# Patient Record
Sex: Female | Born: 1995 | Race: Black or African American | Hispanic: No | Marital: Single | Smoking: Never smoker
Health system: Southern US, Community
[De-identification: ages and names within clinical notes are randomized; demographics above are authoritative.]

## PROBLEM LIST (undated history)

## (undated) DIAGNOSIS — G43909 Migraine, unspecified, not intractable, without status migrainosus: Secondary | ICD-10-CM

## (undated) DIAGNOSIS — N879 Dysplasia of cervix uteri, unspecified: Secondary | ICD-10-CM

## (undated) DIAGNOSIS — B9689 Other specified bacterial agents as the cause of diseases classified elsewhere: Secondary | ICD-10-CM

## (undated) HISTORY — DX: Migraine, unspecified, not intractable, without status migrainosus: G43.909

## (undated) HISTORY — PX: OTHER SURGICAL HISTORY: SHX169

## (undated) HISTORY — DX: Dysplasia of cervix uteri, unspecified: N87.9

## (undated) HISTORY — DX: Other specified bacterial agents as the cause of diseases classified elsewhere: B96.89

---
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
## 2018-08-26 ENCOUNTER — Other Ambulatory Visit: Payer: Self-pay | Admitting: *Deleted

## 2018-08-26 DIAGNOSIS — Z20822 Contact with and (suspected) exposure to covid-19: Secondary | ICD-10-CM

## 2018-09-02 LAB — NOVEL CORONAVIRUS, NAA: SARS-CoV-2, NAA: NOT DETECTED

## 2020-01-09 ENCOUNTER — Ambulatory Visit (HOSPITAL_COMMUNITY)
Admission: EM | Admit: 2020-01-09 | Discharge: 2020-01-09 | Disposition: A | Discharge status: Home / Self Care | Payer: Managed Care, Other (non HMO) | Attending: Family Medicine | Admitting: Family Medicine

## 2020-01-09 ENCOUNTER — Other Ambulatory Visit: Payer: Self-pay

## 2020-01-09 ENCOUNTER — Encounter (HOSPITAL_COMMUNITY): Payer: Self-pay

## 2020-01-09 DIAGNOSIS — M25511 Pain in right shoulder: Secondary | ICD-10-CM

## 2020-01-09 MED ORDER — CYCLOBENZAPRINE HCL 10 MG PO TABS: 10.0000 mg | ORAL_TABLET | Freq: Three times a day (TID) | ORAL | 0 refills | Status: DC | PRN
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
## 2020-03-10 ENCOUNTER — Encounter: Payer: Self-pay | Admitting: Nurse Practitioner

## 2020-03-10 ENCOUNTER — Other Ambulatory Visit: Payer: Self-pay

## 2020-03-10 ENCOUNTER — Ambulatory Visit: Payer: Managed Care, Other (non HMO) | Admitting: Nurse Practitioner

## 2020-03-10 VITALS — BP 116/78 | HR 87 | Temp 98.0°F | Ht 64.0 in | Wt 113.6 lb

## 2020-03-10 DIAGNOSIS — Z7689 Persons encountering health services in other specified circumstances: Secondary | ICD-10-CM | POA: Diagnosis not present

## 2020-03-10 DIAGNOSIS — Z1159 Encounter for screening for other viral diseases: Secondary | ICD-10-CM | POA: Diagnosis not present

## 2020-03-10 DIAGNOSIS — Z0000 Encounter for general adult medical examination without abnormal findings: Secondary | ICD-10-CM

## 2020-03-10 DIAGNOSIS — Z114 Encounter for screening for human immunodeficiency virus [HIV]: Secondary | ICD-10-CM

## 2020-03-10 NOTE — Progress Notes (Signed)
I,Tianna Badgett,acting as a Education administrator for [employer], NP.,have documented all relevant documentation on the behalf of [employer], NP,as directed by  Bary Castilla, NP while in the presence of Bary Castilla, NP.  This visit occurred during the SARS-CoV-2 public health emergency.  Safety protocols were in place, including screening questions prior to the visit, additional usage of staff PPE, and extensive cleaning of exam room while observing appropriate contact time as indicated for disinfecting solutions.  Subjective:     Patient ID: Alexis Conway , female    DOB: 01/09/1996 , 25 y.o.   MRN: [id number]   Chief Complaint  Patient presents with  . Establish Care    HPI  Patient is here to establish care. She has no concerns at this time and would like to get a full physical exam.  She works Surveyor, [employer]. She is currently not in school . Both her Covid shot were done. However, she still has not gotten her booster shot yet. She is still thinking about it. No medical history. She sees a Materials engineer. We will get her medical records from her pediatric office and her OBGYN. She does have a naxplanoon in her left arm. She is sexaully active She does not smoke but she does drink occasionally.  She does not take any medication except OTC pain reliever as needed.     No past medical history on file.   Family History  Problem Relation Age of Onset  . Movement disorder Mother        Tremors  . Hypertension Father   . Diabetes Paternal Grandmother   . Glaucoma Paternal Grandfather      Current Outpatient Medications:  .  ibuprofen (ADVIL) 200 MG tablet, Take 400 mg by mouth every 6 (six) hours as needed., Disp: , Rfl:    Allergies  Allergen Reactions  . No Known Allergies       The patient states she uses naxplanon for birth control. Last LMP was No LMP recorded. Patient has had an implant.. Negative for: breast discharge, breast lump(s), breast pain and breast self exam.  Associated symptoms include abnormal vaginal bleeding. Pertinent negatives include abnormal bleeding (hematology), anxiety, decreased libido, depression, difficulty falling sleep, dyspareunia, history of infertility, nocturia, sexual dysfunction, sleep disturbances, urinary incontinence, urinary urgency, vaginal discharge and vaginal itching. Diet regular.The patient states her exercise level is    . The patient's tobacco use is:  Social History   Tobacco Use  Smoking Status Never Smoker  Smokeless Tobacco Never Used  . She has been exposed to passive smoke. The patient's alcohol use is: occasionally  Social History   Substance and Sexual Activity  Alcohol Use Yes  . Alcohol/week: 1.0 standard drink  . Types: 1 Glasses of wine per week  . Additional information: Last pap will get records from OBGYN   Review of Systems  Constitutional: Negative.  Negative for chills, fatigue and fever.  HENT: Negative.  Negative for congestion.   Eyes: Negative.   Respiratory: Negative.  Negative for cough, chest tightness, shortness of breath and wheezing.   Cardiovascular: Negative.  Negative for chest pain and palpitations.  Gastrointestinal: Negative for abdominal pain, constipation and diarrhea.  Endocrine: Negative.   Genitourinary: Negative.  Negative for dysuria, flank pain and menstrual problem.  Musculoskeletal: Negative.  Negative for back pain and gait problem.  Skin: Negative.   Allergic/Immunologic: Negative.   Neurological: Negative.  Negative for headaches.  Hematological: Negative.   Psychiatric/Behavioral: Negative.  Today's Vitals   03/10/20 0924  BP: 116/78  Pulse: 87  Temp: 98 F (36.7 C)  TempSrc: Oral  Weight: 113 lb 9.6 oz (51.5 kg)  Height: 5' 4" (1.626 m)   Body mass index is 19.5 kg/m.  Wt Readings from Last 3 Encounters:  03/10/20 113 lb 9.6 oz (51.5 kg)  10/04/14 109 lb 3.2 oz (49.5 kg) (16 %, Z= -1.00)*   * Growth percentiles are based on CDC (Girls,  2-20 Years) data.    Objective:  Physical Exam Constitutional:      Appearance: Normal appearance. She is normal weight.  HENT:     Head: Normocephalic.     Right Ear: Tympanic membrane, ear canal and external ear normal.     Left Ear: Tympanic membrane, ear canal and external ear normal.     Nose:     Comments: Deferred. Masked     Mouth/Throat:     Comments: Deferred. Masked  Eyes:     Extraocular Movements: Extraocular movements intact.     Conjunctiva/sclera: Conjunctivae normal.     Pupils: Pupils are equal, round, and reactive to light.  Cardiovascular:     Rate and Rhythm: Normal rate and regular rhythm.     Pulses: Normal pulses.     Heart sounds: Normal heart sounds.  Pulmonary:     Effort: Pulmonary effort is normal. No respiratory distress.     Breath sounds: Normal breath sounds. No wheezing.  Chest:  Breasts:     Tanner Score is 4.     Right: Normal.     Left: Normal.    Abdominal:     General: Bowel sounds are normal.     Tenderness: There is no abdominal tenderness. There is no guarding.  Genitourinary:    Comments: Deferred. Patient see OBGYN. Has Naxplanon in left arm.  Musculoskeletal:        General: Normal range of motion.     Cervical back: Normal range of motion and neck supple.  Skin:    General: Skin is dry.     Capillary Refill: Capillary refill takes less than 2 seconds.  Neurological:     General: No focal deficit present.     Mental Status: She is alert and oriented to person, place, and time. Mental status is at baseline.     Motor: No weakness.     Gait: Gait normal.  Psychiatric:        Mood and Affect: Mood normal.        Behavior: Behavior normal.        Thought Content: Thought content normal.        Judgment: Judgment normal.      Assessment And Plan:     1. Encounter to establish care -Establish care today. Went over patient's medical history, family history, social history.  - CBC - Hemoglobin A1c - CMP14+EGFR -  Lipid panel  2. Encounter for annual physical exam -Discussed the importance of immunization, screening, and taking multivitamins. Discussed the importance of eating healthy and exercising for atleast 30-45 min. Daily. Will check labs today  - CBC - Hemoglobin A1c - CMP14+EGFR - Lipid panel  3. Encounter for screening for HIV - HIV Antibody (routine testing w rflx)  4. Encounter for hepatitis C screening test for low risk patient - Hepatitis C antibody   Follow up: One year for physical or sooner if needed    Patient was given opportunity to ask questions. Patient verbalized understanding of the plan  and was able to repeat key elements of the plan. All questions were answered to their satisfaction.   Bary Castilla, NP   I, Bary Castilla, NP, have reviewed all documentation for this visit. The documentation on 03/10/20 for the exam, diagnosis, procedures, and orders are all accurate and complete.  THE PATIENT IS ENCOURAGED TO PRACTICE SOCIAL DISTANCING DUE TO THE COVID-19 PANDEMIC.

## 2020-03-11 LAB — CBC
Hematocrit: 38.7 % (ref 34.0–46.6)
Hemoglobin: 12.5 g/dL (ref 11.1–15.9)
MCH: 27.5 pg (ref 26.6–33.0)
MCHC: 32.3 g/dL (ref 31.5–35.7)
MCV: 85 fL (ref 79–97)
Platelets: 283 10*3/uL (ref 150–450)
RBC: 4.55 x10E6/uL (ref 3.77–5.28)
RDW: 12.2 % (ref 11.7–15.4)
WBC: 6.7 10*3/uL (ref 3.4–10.8)

## 2020-03-11 LAB — HEMOGLOBIN A1C
Est. average glucose Bld gHb Est-mCnc: 105 mg/dL
Hgb A1c MFr Bld: 5.3 % (ref 4.8–5.6)

## 2020-03-11 LAB — CMP14+EGFR
ALT: 10 IU/L (ref 0–32)
AST: 21 IU/L (ref 0–40)
Albumin/Globulin Ratio: 2 (ref 1.2–2.2)
Albumin: 4.8 g/dL (ref 3.9–5.0)
Alkaline Phosphatase: 62 IU/L (ref 44–121)
BUN/Creatinine Ratio: 10 (ref 9–23)
BUN: 11 mg/dL (ref 6–20)
Bilirubin Total: 0.3 mg/dL (ref 0.0–1.2)
CO2: 23 mmol/L (ref 20–29)
Calcium: 9.6 mg/dL (ref 8.7–10.2)
Chloride: 103 mmol/L (ref 96–106)
Creatinine, Ser: 1.07 mg/dL — ABNORMAL HIGH (ref 0.57–1.00)
GFR calc Af Amer: 84 mL/min/{1.73_m2} (ref >59)
GFR calc non Af Amer: 73 mL/min/{1.73_m2} (ref >59)
Globulin, Total: 2.4 g/dL (ref 1.5–4.5)
Glucose: 68 mg/dL (ref 65–99)
Potassium: 4.2 mmol/L (ref 3.5–5.2)
Sodium: 141 mmol/L (ref 134–144)
Total Protein: 7.2 g/dL (ref 6.0–8.5)

## 2020-03-11 LAB — LIPID PANEL
Chol/HDL Ratio: 2.6 ratio (ref 0.0–4.4)
Cholesterol, Total: 118 mg/dL (ref 100–199)
HDL: 45 mg/dL (ref >39)
LDL Chol Calc (NIH): 60 mg/dL (ref 0–99)
Triglycerides: 60 mg/dL (ref 0–149)
VLDL Cholesterol Cal: 13 mg/dL (ref 5–40)

## 2020-03-11 LAB — HIV ANTIBODY (ROUTINE TESTING W REFLEX): HIV Screen 4th Generation wRfx: NONREACTIVE

## 2020-03-11 LAB — HEPATITIS C ANTIBODY: Hep C Virus Ab: <0.1 s/co ratio (ref 0.0–0.9)

## 2020-03-26 ENCOUNTER — Ambulatory Visit (HOSPITAL_COMMUNITY)
Admission: EM | Admit: 2020-03-26 | Discharge: 2020-03-26 | Disposition: A | Discharge status: Home / Self Care | Payer: Managed Care, Other (non HMO) | Attending: Urgent Care | Admitting: Urgent Care

## 2020-03-26 ENCOUNTER — Other Ambulatory Visit: Payer: Self-pay

## 2020-03-26 ENCOUNTER — Encounter (HOSPITAL_COMMUNITY): Payer: Self-pay

## 2020-03-26 DIAGNOSIS — J029 Acute pharyngitis, unspecified: Secondary | ICD-10-CM | POA: Insufficient documentation

## 2020-03-26 DIAGNOSIS — Z20822 Contact with and (suspected) exposure to covid-19: Secondary | ICD-10-CM | POA: Diagnosis not present

## 2020-03-26 LAB — POCT RAPID STREP A, ED / UC: Streptococcus, Group A Screen (Direct): NEGATIVE

## 2020-03-26 MED ORDER — NAPROXEN 500 MG PO TABS: 500.0000 mg | ORAL_TABLET | Freq: Two times a day (BID) | ORAL | 0 refills | Status: DC

## 2020-03-26 MED ORDER — AMOXICILLIN 500 MG PO CAPS: 500.0000 mg | ORAL_CAPSULE | Freq: Two times a day (BID) | ORAL | 0 refills | Status: DC

## 2020-03-26 NOTE — ED Provider Notes (Signed)
[facility]   MRN: [id number] DOB: [birth year]  Subjective:   Alexis Conway is a 25 y.o. female presenting for 2-day history of acute onset moderate to severe worsening right-sided throat pain neck pain.  Patient states that the throat pain radiates into her right ear.  Has also felt feverish.  She is COVID vaccinated.  Denies cough, chest pain, shortness of breath.  She is not a smoker.  No history of lung disorders.  No sick contacts to her knowledge.  No current facility-administered medications for this encounter.  Current Outpatient Medications:  .  ibuprofen (ADVIL) 200 MG tablet, Take 400 mg by mouth every 6 (six) hours as needed., Disp: , Rfl:    Allergies  Allergen Reactions  . No Known Allergies     History reviewed. No pertinent past medical history.   History reviewed. No pertinent surgical history.  Family History  Problem Relation Age of Onset  . Movement disorder Mother        Tremors  . Hypertension Father   . Diabetes Paternal Grandmother   . Glaucoma Paternal Grandfather     Social History   Tobacco Use  . Smoking status: Never Smoker  . Smokeless tobacco: Never Used  Substance Use Topics  . Alcohol use: Yes    Alcohol/week: 1.0 standard drink    Types: 1 Glasses of wine per week  . Drug use: No    ROS   Objective:   Vitals: BP (!) 133/103   Pulse (!) 112   Temp 100 F (37.8 C)   Resp 17   SpO2 100%   Physical Exam Constitutional:      General: She is not in acute distress.    Appearance: She is well-developed. She is not ill-appearing.  HENT:     Head: Normocephalic and atraumatic.     Right Ear: Tympanic membrane and ear canal normal. No drainage or tenderness. No middle ear effusion. Tympanic membrane is not erythematous.     Left Ear: Tympanic membrane and ear canal normal. No drainage or tenderness.  No middle ear effusion. Tympanic membrane is not erythematous.     Nose: No congestion or rhinorrhea.      Mouth/Throat:     Mouth: Mucous membranes are moist. No oral lesions.     Pharynx: Pharyngeal swelling, oropharyngeal exudate (right sided only) and posterior oropharyngeal erythema (right sided only) present. No uvula swelling.     Tonsils: Tonsillar exudate present. No tonsillar abscesses. 2+ on the right.  Eyes:     Extraocular Movements:     Right eye: Normal extraocular motion.     Left eye: Normal extraocular motion.     Conjunctiva/sclera: Conjunctivae normal.     Pupils: Pupils are equal, round, and reactive to light.  Cardiovascular:     Rate and Rhythm: Normal rate.  Pulmonary:     Effort: Pulmonary effort is normal.  Musculoskeletal:     Cervical back: Normal range of motion and neck supple.  Lymphadenopathy:     Cervical: No cervical adenopathy.  Skin:    General: Skin is warm and dry.  Neurological:     General: No focal deficit present.     Mental Status: She is alert and oriented to person, place, and time.  Psychiatric:        Mood and Affect: Mood normal.        Behavior: Behavior normal.     Results for orders placed or performed during the hospital  encounter of 03/26/20 (from the past 24 hour(s))  POCT Rapid Strep A     Status: None   Collection Time: 03/26/20 11:45 AM  Result Value Ref Range   Streptococcus, Group A Screen (Direct) NEGATIVE NEGATIVE    Assessment and Plan :   PDMP not reviewed this encounter.  1. Pharyngitis, unspecified etiology   2. Sore throat     Will treat empirically for pharyngitis given physical exam findings.  Patient is to start amoxicillin, use supportive care otherwise.  Strep culture and COVID-19 testing pending.  Counseled patient on potential for adverse effects with medications prescribed/recommended today, ER and return-to-clinic precautions discussed, patient verbalized understanding.    Wallis Bamberg, PA-C 03/26/20 1154

## 2020-03-26 NOTE — ED Triage Notes (Signed)
Pt in with c/o tonsil inflammation on the right side that has been going on for 3 days now  Pt has tried ibuprofen and warm water gargles with no relief

## 2020-03-27 LAB — SARS CORONAVIRUS 2 (TAT 6-24 HRS): SARS Coronavirus 2: NEGATIVE

## 2020-03-28 LAB — CULTURE, GROUP A STREP (THRC)

## 2020-12-02 ENCOUNTER — Encounter: Payer: Self-pay | Admitting: Internal Medicine

## 2020-12-13 ENCOUNTER — Encounter: Payer: Self-pay | Admitting: Nurse Practitioner

## 2020-12-13 DIAGNOSIS — N879 Dysplasia of cervix uteri, unspecified: Secondary | ICD-10-CM | POA: Insufficient documentation

## 2020-12-22 ENCOUNTER — Encounter: Payer: Self-pay | Admitting: Nurse Practitioner

## 2021-01-06 ENCOUNTER — Other Ambulatory Visit: Payer: Self-pay

## 2021-01-06 DIAGNOSIS — N926 Irregular menstruation, unspecified: Secondary | ICD-10-CM | POA: Insufficient documentation

## 2021-01-06 DIAGNOSIS — N946 Dysmenorrhea, unspecified: Secondary | ICD-10-CM | POA: Insufficient documentation

## 2021-01-09 ENCOUNTER — Other Ambulatory Visit (INDEPENDENT_AMBULATORY_CARE_PROVIDER_SITE_OTHER): Payer: Managed Care, Other (non HMO)

## 2021-01-09 ENCOUNTER — Encounter: Payer: Self-pay | Admitting: Nurse Practitioner

## 2021-01-09 ENCOUNTER — Ambulatory Visit: Payer: Managed Care, Other (non HMO) | Admitting: Nurse Practitioner

## 2021-01-09 VITALS — BP 100/60 | HR 78 | Ht 65.0 in | Wt 118.0 lb

## 2021-01-09 DIAGNOSIS — R110 Nausea: Secondary | ICD-10-CM | POA: Insufficient documentation

## 2021-01-09 DIAGNOSIS — R197 Diarrhea, unspecified: Secondary | ICD-10-CM | POA: Diagnosis not present

## 2021-01-09 DIAGNOSIS — R1030 Lower abdominal pain, unspecified: Secondary | ICD-10-CM

## 2021-01-09 DIAGNOSIS — R1012 Left upper quadrant pain: Secondary | ICD-10-CM

## 2021-01-09 DIAGNOSIS — A048 Other specified bacterial intestinal infections: Secondary | ICD-10-CM | POA: Diagnosis not present

## 2021-01-09 LAB — CBC WITH DIFFERENTIAL/PLATELET
Basophils Absolute: 0.1 10*3/uL (ref 0.0–0.1)
Basophils Relative: 0.8 % (ref 0.0–3.0)
Eosinophils Absolute: 0.1 10*3/uL (ref 0.0–0.7)
Eosinophils Relative: 2 % (ref 0.0–5.0)
HCT: 38.8 % (ref 36.0–46.0)
Hemoglobin: 12.7 g/dL (ref 12.0–15.0)
Lymphocytes Relative: 29.1 % (ref 12.0–46.0)
Lymphs Abs: 2.1 10*3/uL (ref 0.7–4.0)
MCHC: 32.7 g/dL (ref 30.0–36.0)
MCV: 86.4 fl (ref 78.0–100.0)
Monocytes Absolute: 0.5 10*3/uL (ref 0.1–1.0)
Monocytes Relative: 6.3 % (ref 3.0–12.0)
Neutro Abs: 4.4 10*3/uL (ref 1.4–7.7)
Neutrophils Relative %: 61.8 % (ref 43.0–77.0)
Platelets: 269 10*3/uL (ref 150.0–400.0)
RBC: 4.49 Mil/uL (ref 3.87–5.11)
RDW: 12.8 % (ref 11.5–15.5)
WBC: 7.2 10*3/uL (ref 4.0–10.5)

## 2021-01-09 MED ORDER — HYOSCYAMINE SULFATE 0.125 MG SL SUBL: 0.1250 mg | SUBLINGUAL_TABLET | Freq: Four times a day (QID) | SUBLINGUAL | 1 refills | Status: DC | PRN

## 2021-01-09 NOTE — Patient Instructions (Addendum)
The [facility] providers would like to encourage you to use [facility] to communicate with providers for non-urgent requests or questions.  Due to long hold times on the telephone, sending your provider a message by [facility] may be faster and more efficient way to get a response. Please allow 48 business hours for a response.  Please remember that this is for non-urgent requests/questions.  LABS:  Lab work has been ordered for you today. Our lab is located in the basement. Press "B" on the elevator. The lab is located at the first door on the left as you exit the elevator.  HEALTHCARE LAWS AND MY CHART RESULTS: Due to recent changes in healthcare laws, you may see the results of your imaging and laboratory studies on MyChart before your provider has had a chance to review them.   We understand that in some cases there may be results that are confusing or concerning to you. Not all laboratory results come back in the same time frame and the provider may be waiting for multiple results in order to interpret others.  Please give [place] 48 hours in order for your provider to thoroughly review all the results before contacting the office for clarification of your results.   RECOMMENDATIONS: Bland diet No dairy products other than yogurt. Take a probiotic of your choice once a day.  MEDICATION: We have sent the following medication to your pharmacy for you to pick up at your convenience: Hyoscyamine (LEVSIN SL) 0.125 MG SL tablet: Place 1 tablet (0.125 mg total) under the tongue every 6-8 hours as needed.  We have scheduled you a follow up with Dr. Rhea Belton on 03/22/21 at 8:30 am. Please call our office if your symptoms worsen.  It was great seeing you today! Thank you for entrusting me with your care and choosing [facility].  Arnaldo Natal, CRNP  [facility] Diet A bland diet consists of foods that are often soft and do not have a lot of fat, fiber, or extra seasonings. Foods without fat,  fiber, or seasoning are easier for the body to digest. They are also less likely to irritate your mouth, throat, stomach, and other parts of your digestive system. A bland diet is sometimes called a BRAT diet. What is my plan? Your health care provider or food and nutrition specialist (dietitian) may recommend specific changes to your diet to prevent symptoms or to treat your symptoms. These changes may include: Eating small meals often. Cooking food until it is soft enough to chew easily. Chewing your food well. Drinking fluids slowly. Not eating foods that are very spicy, sour, or fatty. Not eating citrus fruits, such as oranges and grapefruit. What do I need to know about this diet? Eat a variety of foods from the bland diet food list. Do not follow a bland diet longer than needed. Ask your health care provider whether you should take vitamins or supplements. What foods can I eat? Grains Hot cereals, such as cream of wheat. Rice. Bread, crackers, or tortillas made from refined white flour. Vegetables Canned or cooked vegetables. Mashed or boiled potatoes. Fruits Bananas. Applesauce. Other types of cooked or canned fruit with the skin and seeds removed, such as canned peaches or pears. Meats and other proteins Scrambled eggs. Creamy peanut butter or other nut butters. Lean, well-cooked meats, such as chicken or fish. Tofu. Soups or broths. Dairy Low-fat dairy products, such as milk, cottage cheese, or yogurt. Beverages Water. Herbal tea. Apple juice. Fats and oils Mild  salad dressings. Canola or olive oil. Sweets and desserts Pudding. Custard. Fruit gelatin. Ice cream. The items listed above may not be a complete list of recommended foods and beverages. Contact a dietitian for more options. What foods are not recommended? Grains Whole grain breads and cereals. Vegetables Raw vegetables. Fruits Raw fruits, especially citrus, berries, or dried fruits. Dairy Whole fat dairy  foods. Beverages Caffeinated drinks. Alcohol. Seasonings and condiments Strongly flavored seasonings or condiments. Hot sauce. Salsa. Other foods Spicy foods. Fried foods. Sour foods, such as pickled or fermented foods. Foods with high sugar content. Foods high in fiber. The items listed above may not be a complete list of foods and beverages to avoid. Contact a dietitian for more information. Summary A bland diet consists of foods that are often soft and do not have a lot of fat, fiber, or extra seasonings. Foods without fat, fiber, or seasoning are easier for the body to digest. Check with your health care provider to see how long you should follow this diet plan. It is not meant to be followed for long periods. This information is not intended to replace advice given to you by your health care provider. Make sure you discuss any questions you have with your health care provider. Document Revised: 03/06/2017 Document Reviewed: 03/06/2017 Elsevier Patient Education  2022 [employer].  If you are age 25 or older, your body mass index should be between 23-30. Your Body mass index is 19.64 kg/m. If this is out of the aforementioned range listed, please consider follow up with your Primary Care Provider.  If you are age 25 or younger, your body mass index should be between 19-25. Your Body mass index is 19.64 kg/m. If this is out of the aformentioned range listed, please consider follow up with your Primary Care Provider.

## 2021-01-09 NOTE — Progress Notes (Signed)
Addendum: Reviewed and agree with assessment and management plan. Verland Sprinkle M, MD  

## 2021-01-09 NOTE — Progress Notes (Signed)
01/09/2021 Alexis Conway [id number] [birth year]   CHIEF COMPLAINT: Abdominal pain, diarrhea  HISTORY OF PRESENT ILLNESS: Alexis Conway is a 25 year old female with a past medical history of dysmenorrhea otherwise noncontributory.  S/P Nexplanon insertion otherwise no past surgical history.  She presents to our office today as referred by Tona Sensing FNP for further evaluation regarding nausea without vomiting. She is accompanied by her mother.  She complains of having episodic nausea, central lower abdominal pain which radiates to the LUQ with intermittent diarrhea since 11/26/2020.  No specific food or stress triggers.  She was treated with an antibiotic for 3 or 4 days for a UTI the end of September 2022.  She describes having episodes of nausea with central abdominal pain which shoots up to the left upper quadrant and can last all day long then goes away for several days at a time then recurs.  She also noticed a change in her bowel pattern.  She typically passed a normal formed brown bowel movement once daily.  No history of rectal bleeding or black stools.  She developednonbloody diarrhea when her nausea and abdominal pain started.  She described having diarrhea for few days followed by passing soft stools for a few days then her stools were normal for 1 week at a time then the cycle repeated.  She denies having any abdominal pain for the past week.  No further nausea.  She passed a solid formed bowel movement earlier today.  She drinks 8 ounces of coffee with oat milk most days.  No history of lactose intolerance.  No weight loss.  No fever, sweats or chills.  No known family history of IBD or colorectal cancer.  No other complaints at this time.  Social History: She is single. She works in [employer]. Nonsmoker. She drink one glass of wine weekly. No drug use.   Family History: Mother age 5 with history of thyroid disease (Grave's disease).  Father with history of diabetes and  DM. Sister age 48 is healthy.  Paternal grandmother with diabetes.  Paternal grandfather with glaucoma.  No family history of esophageal, gastric or colorectal cancer.  Allergies  Allergen Reactions   No Known Allergies       Outpatient Encounter Medications as of 01/09/2021  Medication Sig   Etonogestrel (NEXPLANON [place]) Inject into the skin.   naproxen (NAPROSYN) 500 MG tablet Take 500 mg by mouth 2 (two) times daily as needed (for cramps when she has her period).   [DISCONTINUED] naproxen (NAPROSYN) 500 MG tablet Take 1 tablet (500 mg total) by mouth 2 (two) times daily with a meal. (Patient taking differently: Take 500 mg by mouth 2 (two) times daily as needed (for cramps when she has her period).)   No facility-administered encounter medications on file as of 01/09/2021.   REVIEW OF SYSTEMS:  Gen: Denies fever, sweats or chills. No weight loss.  CV: Denies chest pain, palpitations or edema. Resp: Denies cough, shortness of breath of hemoptysis.  GI: See HPI. GU : Denies urinary burning, blood in urine, increased urinary frequency or incontinence. MS: Denies joint pain, muscles aches or weakness. Derm: Denies rash, itchiness, skin lesions or unhealing ulcers. Psych: Denies depression, anxiety or memory loss. Heme: Denies bruising, bleeding. Neuro:  Denies headaches, dizziness or paresthesias. Endo:  Denies any problems with DM, thyroid or adrenal function.  PHYSICAL EXAM: BP 100/60   Pulse 78   Ht 5\' 5"  (1.651 m)   Wt 118 lb (  53.5 kg)   BMI 19.64 kg/m  Nexplanon x 2 years, no menstrual cycles  General: Pleasant 25 year old female in no acute distress. Head: Normocephalic and atraumatic. Eyes:  Sclerae non-icteric, conjunctive pink. Ears: Normal auditory acuity. Mouth: Dentition intact. No ulcers or lesions.  Neck: Supple, no lymphadenopathy or thyromegaly.  Lungs: Clear bilaterally to auscultation without wheezes, crackles or rhonchi. Heart: Regular rate and rhythm. No  murmur, rub or gallop appreciated.  Abdomen: Soft, nontender, non distended. No masses. No hepatosplenomegaly. Normoactive bowel sounds x 4 quadrants.  Rectal: Deferred Musculoskeletal: Symmetrical with no gross deformities. Skin: Warm and dry. No rash or lesions on visible extremities. Extremities: No edema. Neurological: Alert oriented x 4, no focal deficits.  Psychological:  Alert and cooperative. Normal mood and affect.  ASSESSMENT AND PLAN:  69) 25 year old female with episodic nausea without vomiting, central lower abdominal pain which radiates to the LUQ with intermittent diarrhea x 5 weeks. No abdominal pain or diarrhea for the past week.  She was treated with an antibiotic for UTI and of September 2022. -CBC, CMP and CRP.  H. pylori stool antigen. -GI pathogen panel to be completed if diarrhea recurs  -Hyoscyamine 0.125 mg 1 tab sublingual every 6 to 8 hours as needed abdominal pain -Bacteria probiotic of choice once daily for the next few months -May try yogurt with probiotic per patient's preference -CTAP deferred for now as her last episode of abdominal pain was 1 week ago and her abdominal exam today was negative. -Consider EGD/colonoscopy if her symptoms persist or worsen -Patient to call our office if her symptoms recur -Follow-up in office in 6 weeks    CC:  Dorothyann Peng, MD

## 2021-01-10 LAB — COMPREHENSIVE METABOLIC PANEL
ALT: 9 U/L (ref 0–35)
AST: 18 U/L (ref 0–37)
Albumin: 4.8 g/dL (ref 3.5–5.2)
Alkaline Phosphatase: 53 U/L (ref 39–117)
BUN: 18 mg/dL (ref 6–23)
CO2: 24 mEq/L (ref 19–32)
Calcium: 9.4 mg/dL (ref 8.4–10.5)
Chloride: 101 mEq/L (ref 96–112)
Creatinine, Ser: 1 mg/dL (ref 0.40–1.20)
GFR: 78.5 mL/min (ref >60.00)
Glucose, Bld: 74 mg/dL (ref 70–99)
Potassium: 4 mEq/L (ref 3.5–5.1)
Sodium: 137 mEq/L (ref 135–145)
Total Bilirubin: 0.3 mg/dL (ref 0.2–1.2)
Total Protein: 7.5 g/dL (ref 6.0–8.3)

## 2021-01-10 LAB — C-REACTIVE PROTEIN: CRP: <1 mg/dL (ref 0.5–20.0)

## 2021-02-08 ENCOUNTER — Encounter: Payer: Self-pay | Admitting: *Deleted

## 2021-02-10 ENCOUNTER — Encounter (HOSPITAL_COMMUNITY): Payer: Self-pay

## 2021-02-10 ENCOUNTER — Other Ambulatory Visit: Payer: Self-pay

## 2021-02-10 ENCOUNTER — Ambulatory Visit (HOSPITAL_COMMUNITY)
Admission: EM | Admit: 2021-02-10 | Discharge: 2021-02-10 | Disposition: A | Discharge status: Home / Self Care | Payer: Managed Care, Other (non HMO) | Attending: Family Medicine | Admitting: Family Medicine

## 2021-02-10 DIAGNOSIS — M549 Dorsalgia, unspecified: Secondary | ICD-10-CM

## 2021-02-10 DIAGNOSIS — M545 Low back pain, unspecified: Secondary | ICD-10-CM

## 2021-02-10 MED ORDER — DICLOFENAC SODIUM 75 MG PO TBEC: 75.0000 mg | DELAYED_RELEASE_TABLET | Freq: Two times a day (BID) | ORAL | 0 refills | Status: DC | PRN

## 2021-02-10 MED ORDER — TIZANIDINE HCL 4 MG PO TABS: 4.0000 mg | ORAL_TABLET | Freq: Three times a day (TID) | ORAL | 0 refills | Status: DC | PRN

## 2021-02-10 NOTE — ED Triage Notes (Signed)
Pt c/o back pain radiating down rt hip x1wk. Denies injury. States had swelling to RUE yesterday.

## 2021-02-10 NOTE — ED Provider Notes (Signed)
[place]    CSN: [id number] Arrival date & time: 02/10/21  1313      History   Chief Complaint Chief Complaint  Patient presents with   Back Pain    HPI Alexis Conway is a 25 y.o. female.    Back Pain Here for about a 1 week h/o upper and lower back pain, coming and going, worsened or elicited by bending and by lifting. No trauma or injury. No f/c/dysuria/new vag dc.  Has the nexplanon. Has tried 800 mg ibuprofen without relief.   Past Medical History:  Diagnosis Date   Bacterial vaginosis    Cervical intraepithelial neoplasia    Migraine     Patient Active Problem List   Diagnosis Date Noted   Diarrhea 01/09/2021   Nausea without vomiting 01/09/2021   Dysmenorrhea 01/06/2021   Irregular periods 01/06/2021   Dysplasia of cervix 12/13/2020   Migraine without aura and without status migrainosus, not intractable 10/04/2014   Menstrual migraine without status migrainosus, not intractable 10/04/2014   Tension headache 10/04/2014    Past Surgical History:  Procedure Laterality Date   nexplanon inserted      OB History   No obstetric history on file.      Home Medications    Prior to Admission medications   Medication Sig Start Date End Date Taking? Authorizing Provider  diclofenac (VOLTAREN) 75 MG EC tablet Take 1 tablet (75 mg total) by mouth 2 (two) times daily as needed (pain). 02/10/21  Yes Barrett Henle, MD  tiZANidine (ZANAFLEX) 4 MG tablet Take 1 tablet (4 mg total) by mouth every 8 (eight) hours as needed for muscle spasms. 02/10/21  Yes Barrett Henle, MD  Etonogestrel [facility]) Inject into the skin.    [provider]  hyoscyamine (LEVSIN SL) 0.125 MG SL tablet Place 1 tablet (0.125 mg total) under the tongue every 6 (six) hours as needed. 01/09/21   Noralyn Pick, NP    Family History Family History  Problem Relation Age of Onset   Movement disorder Mother        Tremors   Hypertension Father     Diabetes Paternal Grandmother    Glaucoma Paternal Grandfather    Colon cancer Neg Hx    Esophageal cancer Neg Hx    Rectal cancer Neg Hx     Social History Social History   Tobacco Use   Smoking status: Never   Smokeless tobacco: Never  Vaping Use   Vaping Use: Never used  Substance Use Topics   Alcohol use: Yes    Alcohol/week: 1.0 standard drink    Types: 1 Glasses of wine per week   Drug use: No     Allergies   No known allergies   Review of Systems Review of Systems  Musculoskeletal:  Positive for back pain.    Physical Exam Triage Vital Signs ED Triage Vitals  Enc Vitals Group     BP 02/10/21 1333 (!) 133/92     Pulse Rate 02/10/21 1333 93     Resp 02/10/21 1333 16     Temp 02/10/21 1333 98.2 F (36.8 C)     Temp Source 02/10/21 1333 Oral     SpO2 02/10/21 1333 98 %     Weight --      Height --      Head Circumference --      Peak Flow --      Pain Score 02/10/21 1334 4  Pain Loc --      Pain Edu? --      Excl. in [place]? --    No data found.  Updated Vital Signs BP (!) 133/92 (BP Location: Right Arm)    Pulse 93    Temp 98.2 F (36.8 C) (Oral)    Resp 16    SpO2 98%   Visual Acuity Right Eye Distance:   Left Eye Distance:   Bilateral Distance:    Right Eye Near:   Left Eye Near:    Bilateral Near:     Physical Exam Vitals reviewed.  Constitutional:      General: She is not in acute distress.    Appearance: She is not toxic-appearing.  Cardiovascular:     Rate and Rhythm: Normal rate and regular rhythm.     Heart sounds: No murmur heard. Pulmonary:     Effort: Pulmonary effort is normal.     Breath sounds: Normal breath sounds.  Musculoskeletal:        General: No swelling or tenderness.     Cervical back: Neck supple.  Lymphadenopathy:     Cervical: No cervical adenopathy.  Skin:    Capillary Refill: Capillary refill takes less than 2 seconds.  Neurological:     General: No focal deficit present.     Mental Status: She is  alert and oriented to person, place, and time.  Psychiatric:        Behavior: Behavior normal.     UC Treatments / Results  Labs (all labs ordered are listed, but only abnormal results are displayed) Labs Reviewed - No data to display  EKG   Radiology No results found.  Procedures Procedures (including critical care time)  Medications Ordered in UC Medications - No data to display  Initial Impression / Assessment and Plan / UC Course  I have reviewed the triage vital signs and the nursing notes.  Pertinent labs & imaging results that were available during my care of the patient were reviewed by me and considered in my medical decision making (see chart for details).     Muscular pain/strain. Will give stretch exercises, use local heat. Tizanidine and NSAID Final Clinical Impressions(s) / UC Diagnoses   Final diagnoses:  Upper back pain  Acute bilateral low back pain without sciatica     Discharge Instructions      Use a heating pad. Do some of the stretching exercises.  Tizanidine 4 mg every 8 hours as needed for muscle spasm (causes sleepiness)  Diclofenac twice daily as needed for pain.     ED Prescriptions     Medication Sig Dispense Auth. Provider   tiZANidine (ZANAFLEX) 4 MG tablet Take 1 tablet (4 mg total) by mouth every 8 (eight) hours as needed for muscle spasms. 30 tablet Elven Laboy, Janace Aris, MD   diclofenac (VOLTAREN) 75 MG EC tablet Take 1 tablet (75 mg total) by mouth 2 (two) times daily as needed (pain). 30 tablet Analyn Matusek, Janace Aris, MD      I have reviewed the PDMP during this encounter.   Zenia Resides, MD 02/10/21 1357

## 2021-02-10 NOTE — Discharge Instructions (Addendum)
Use a heating pad. Do some of the stretching exercises.  Tizanidine 4 mg every 8 hours as needed for muscle spasm (causes sleepiness)  Diclofenac twice daily as needed for pain.

## 2021-02-22 ENCOUNTER — Ambulatory Visit: Payer: Managed Care, Other (non HMO) | Admitting: Nurse Practitioner

## 2021-02-22 ENCOUNTER — Other Ambulatory Visit: Payer: Self-pay

## 2021-02-22 VITALS — BP 110/60 | HR 94 | Temp 98.7°F | Ht 65.0 in | Wt 118.6 lb

## 2021-02-22 DIAGNOSIS — R82998 Other abnormal findings in urine: Secondary | ICD-10-CM

## 2021-02-22 DIAGNOSIS — R109 Unspecified abdominal pain: Secondary | ICD-10-CM

## 2021-02-22 DIAGNOSIS — N3000 Acute cystitis without hematuria: Secondary | ICD-10-CM | POA: Diagnosis not present

## 2021-02-22 LAB — POCT URINALYSIS DIPSTICK
Bilirubin, UA: NEGATIVE
Blood, UA: NEGATIVE
Glucose, UA: NEGATIVE
Ketones, UA: NEGATIVE
Nitrite, UA: POSITIVE
Protein, UA: NEGATIVE
Spec Grav, UA: 1.025 (ref 1.010–1.025)
Urobilinogen, UA: 0.2 E.U./dL
pH, UA: 7 (ref 5.0–8.0)

## 2021-02-22 MED ORDER — NITROFURANTOIN MONOHYD MACRO 100 MG PO CAPS: 100.0000 mg | ORAL_CAPSULE | Freq: Two times a day (BID) | ORAL | 0 refills | Status: AC

## 2021-02-22 NOTE — Patient Instructions (Signed)

## 2021-02-22 NOTE — Progress Notes (Signed)
I,Tianna Badgett,acting as a Education administrator for [employer], NP.,have documented all relevant documentation on the behalf of [employer], NP,as directed by  Bary Castilla, NP while in the presence of Bary Castilla, NP.  This visit occurred during the SARS-CoV-2 public health emergency.  Safety protocols were in place, including screening questions prior to the visit, additional usage of staff PPE, and extensive cleaning of exam room while observing appropriate contact time as indicated for disinfecting solutions.  Subjective:     Patient ID: Alexis Conway , female    DOB: 23-Oct-1995 , 26 y.o.   MRN: [id number]   Chief Complaint  Patient presents with   Back Pain    HPI  Patient is here today for back pain. This started couple of weeks ago. No symptoms of urinary frequency, burning or fever.  she does have muscle pain in the back flank area. Will check her for UTI   Back Pain This is a new problem. The current episode started 1 to 4 weeks ago. The problem occurs constantly. The problem has been gradually improving since onset. The pain is present in the lumbar spine and thoracic spine. The quality of the pain is described as aching, burning, cramping, shooting and stabbing. The pain radiates to the right thigh. The pain is at a severity of 8/10. The pain is moderate. The pain is The same all the time. The symptoms are aggravated by twisting and bending. Stiffness is present In the morning. Pertinent negatives include no chest pain, fever, headaches or weakness. She has tried muscle relaxant for the symptoms. The treatment provided mild relief.    Past Medical History:  Diagnosis Date   Bacterial vaginosis    Cervical intraepithelial neoplasia    Migraine      Family History  Problem Relation Age of Onset   Movement disorder Mother        Tremors   Hypertension Father    Diabetes Paternal Grandmother    Glaucoma Paternal Grandfather    Colon cancer Neg Hx    Esophageal  cancer Neg Hx    Rectal cancer Neg Hx      Current Outpatient Medications:    nitrofurantoin, macrocrystal-monohydrate, (MACROBID) 100 MG capsule, Take 1 capsule (100 mg total) by mouth 2 (two) times daily for 7 days., Disp: 14 capsule, Rfl: 0   diclofenac (VOLTAREN) 75 MG EC tablet, Take 1 tablet (75 mg total) by mouth 2 (two) times daily as needed (pain)., Disp: 30 tablet, Rfl: 0   Etonogestrel (NEXPLANON [place]), Inject into the skin., Disp: , Rfl:    hyoscyamine (LEVSIN SL) 0.125 MG SL tablet, Place 1 tablet (0.125 mg total) under the tongue every 6 (six) hours as needed., Disp: 30 tablet, Rfl: 1   tiZANidine (ZANAFLEX) 4 MG tablet, Take 1 tablet (4 mg total) by mouth every 8 (eight) hours as needed for muscle spasms., Disp: 30 tablet, Rfl: 0   Allergies  Allergen Reactions   No Known Allergies      Review of Systems  Constitutional: Negative.  Negative for chills and fever.  Respiratory: Negative.  Negative for cough, shortness of breath and wheezing.   Cardiovascular: Negative.  Negative for chest pain and palpitations.  Gastrointestinal: Negative.   Genitourinary:  Positive for flank pain. Negative for urgency and vaginal discharge.  Musculoskeletal:  Positive for back pain.  Neurological: Negative.  Negative for weakness and headaches.    Today's Vitals   02/22/21 1518  BP: 110/60  Pulse: 94  Temp:  98.7 F (37.1 C)  TempSrc: Oral  Weight: 118 lb 9.6 oz (53.8 kg)  Height: 5\' 5"  (1.651 m)   Body mass index is 19.74 kg/m.   Objective:  Physical Exam Constitutional:      Appearance: Normal appearance.  HENT:     Head: Normocephalic and atraumatic.  Cardiovascular:     Rate and Rhythm: Normal rate and regular rhythm.     Pulses: Normal pulses.     Heart sounds: Normal heart sounds. No murmur heard. Skin:    General: Skin is warm and dry.  Neurological:     Mental Status: She is alert.        Assessment And Plan:     1. Flank pain - POCT Urinalysis Dipstick  (81002)  2. Leukocytes in urine - nitrofurantoin, macrocrystal-monohydrate, (MACROBID) 100 MG capsule; Take 1 capsule (100 mg total) by mouth 2 (two) times daily for 7 days.  Dispense: 14 capsule; Refill: 0 - Culture, Urine  3. Acute cystitis without hematuria - nitrofurantoin, macrocrystal-monohydrate, (MACROBID) 100 MG capsule; Take 1 capsule (100 mg total) by mouth 2 (two) times daily for 7 days.  Dispense: 14 capsule; Refill: 0 - Culture, Urine   Prescription sent to pharmacy. Discussed medication disired effects, side effects, and how to administer medication. Urine sent for culture. Make take OTC medications as needed. Increase oral fluid intake. Follow up for worsening or persistent symptoms. Patient verbalized understanding regarding plan of care and all questions answered.   The patient was encouraged to call or send a message through [place] for any questions or concerns.   Follow up: if symptoms persist or do not get better.   Side effects and appropriate use of all the medication(s) were discussed with the patient today. Patient advised to use the medication(s) as directed by their healthcare provider. The patient was encouraged to read, review, and understand all associated package inserts and contact our office with any questions or concerns. The patient accepts the risks of the treatment plan and had an opportunity to ask questions.   Patient was given opportunity to ask questions. Patient verbalized understanding of the plan and was able to repeat key elements of the plan. All questions were answered to their satisfaction.  Raman Veda Arrellano, DNP   I, Raman Pepper Wyndham have reviewed all documentation for this visit. The documentation on 02/23/20 for the exam, diagnosis, procedures, and orders are all accurate and complete.    IF YOU HAVE BEEN REFERRED TO A SPECIALIST, IT MAY TAKE 1-2 WEEKS TO SCHEDULE/PROCESS THE REFERRAL. IF YOU HAVE NOT HEARD FROM US/SPECIALIST IN TWO WEEKS, PLEASE  GIVE [place] A CALL AT [phone].   THE PATIENT IS ENCOURAGED TO PRACTICE SOCIAL DISTANCING DUE TO THE COVID-19 PANDEMIC.

## 2021-02-24 LAB — URINE CULTURE

## 2021-03-05 ENCOUNTER — Ambulatory Visit (HOSPITAL_COMMUNITY)
Admission: EM | Admit: 2021-03-05 | Discharge: 2021-03-05 | Disposition: A | Discharge status: Home / Self Care | Payer: Managed Care, Other (non HMO) | Attending: Student | Admitting: Student

## 2021-03-05 ENCOUNTER — Other Ambulatory Visit: Payer: Self-pay

## 2021-03-05 ENCOUNTER — Encounter (HOSPITAL_COMMUNITY): Payer: Self-pay | Admitting: *Deleted

## 2021-03-05 DIAGNOSIS — J0100 Acute maxillary sinusitis, unspecified: Secondary | ICD-10-CM | POA: Diagnosis not present

## 2021-03-05 DIAGNOSIS — Z789 Other specified health status: Secondary | ICD-10-CM

## 2021-03-05 MED ORDER — AMOXICILLIN 875 MG PO TABS: 875.0000 mg | ORAL_TABLET | Freq: Two times a day (BID) | ORAL | 0 refills | Status: AC

## 2021-03-05 NOTE — Discharge Instructions (Signed)
-  Amoxicillin twice daily x7 days ° °

## 2021-03-05 NOTE — ED Triage Notes (Signed)
Pt reports having sinus problems and she feels like the top of her mouth is caving in. Pt reports she has to mouth breath.

## 2021-03-05 NOTE — ED Provider Notes (Signed)
[place]    CSN: [id number] Arrival date & time: 03/05/21  1716      History   Chief Complaint Chief Complaint  Patient presents with   Facial Pain   mouth pain   Generalized Body Aches    HPI Alexis Conway is a 26 y.o. female presenting with concern for sinusitis. Medical history noncontributory. Sinus pressure x3 weeks following viral URI, now with increasingly purulent nasal congestion and PND. Initially with cough and congestion, this has improved. Some teeth pain. Denies fevers/chills, SOB, CP, dizziness, etc. Has attempted daytime cold and flu and decongestant. Denies history pulm ds. Implant contraception.  HPI  Past Medical History:  Diagnosis Date   Bacterial vaginosis    Cervical intraepithelial neoplasia    Migraine     Patient Active Problem List   Diagnosis Date Noted   Diarrhea 01/09/2021   Nausea without vomiting 01/09/2021   Dysmenorrhea 01/06/2021   Irregular periods 01/06/2021   Dysplasia of cervix 12/13/2020   Migraine without aura and without status migrainosus, not intractable 10/04/2014   Menstrual migraine without status migrainosus, not intractable 10/04/2014   Tension headache 10/04/2014    Past Surgical History:  Procedure Laterality Date   nexplanon inserted      OB History   No obstetric history on file.      Home Medications    Prior to Admission medications   Medication Sig Start Date End Date Taking? Authorizing Provider  amoxicillin (AMOXIL) 875 MG tablet Take 1 tablet (875 mg total) by mouth 2 (two) times daily for 7 days. 03/05/21 03/12/21 Yes Hazel Sams, PA-C  diclofenac (VOLTAREN) 75 MG EC tablet Take 1 tablet (75 mg total) by mouth 2 (two) times daily as needed (pain). 02/10/21   Barrett Henle, MD  Etonogestrel [facility]) Inject into the skin.    [provider]  hyoscyamine (LEVSIN SL) 0.125 MG SL tablet Place 1 tablet (0.125 mg total) under the tongue every 6 (six) hours as needed.  01/09/21   Noralyn Pick, NP  tiZANidine (ZANAFLEX) 4 MG tablet Take 1 tablet (4 mg total) by mouth every 8 (eight) hours as needed for muscle spasms. 02/10/21   Barrett Henle, MD    Family History Family History  Problem Relation Age of Onset   Movement disorder Mother        Tremors   Hypertension Father    Diabetes Paternal Grandmother    Glaucoma Paternal Grandfather    Colon cancer Neg Hx    Esophageal cancer Neg Hx    Rectal cancer Neg Hx     Social History Social History   Tobacco Use   Smoking status: Never   Smokeless tobacco: Never  Vaping Use   Vaping Use: Never used  Substance Use Topics   Alcohol use: Yes    Alcohol/week: 1.0 standard drink    Types: 1 Glasses of wine per week   Drug use: No     Allergies   No known allergies   Review of Systems Review of Systems  Constitutional:  Negative for appetite change, chills and fever.  HENT:  Positive for congestion and sinus pressure. Negative for ear pain, rhinorrhea, sinus pain and sore throat.   Eyes:  Negative for redness and visual disturbance.  Respiratory:  Negative for cough, chest tightness, shortness of breath and wheezing.   Cardiovascular:  Negative for chest pain and palpitations.  Gastrointestinal:  Negative for abdominal pain, constipation, diarrhea, nausea and vomiting.  Genitourinary:  Negative for dysuria, frequency and urgency.  Musculoskeletal:  Negative for myalgias.  Neurological:  Negative for dizziness, weakness and headaches.  Psychiatric/Behavioral:  Negative for confusion.   All other systems reviewed and are negative.   Physical Exam Triage Vital Signs ED Triage Vitals  Enc Vitals Group     BP      Pulse      Resp      Temp      Temp src      SpO2      Weight      Height      Head Circumference      Peak Flow      Pain Score      Pain Loc      Pain Edu?      Excl. in [place]?    No data found.  Updated Vital Signs BP 125/87    Pulse 91    Temp 98.1  F (36.7 C)    Resp 18    SpO2 97%   Visual Acuity Right Eye Distance:   Left Eye Distance:   Bilateral Distance:    Right Eye Near:   Left Eye Near:    Bilateral Near:     Physical Exam Vitals reviewed.  Constitutional:      General: She is not in acute distress.    Appearance: Normal appearance. She is not ill-appearing.  HENT:     Head: Normocephalic and atraumatic.     Right Ear: Tympanic membrane, ear canal and external ear normal. No tenderness. No middle ear effusion. There is no impacted cerumen. Tympanic membrane is not perforated, erythematous, retracted or bulging.     Left Ear: Tympanic membrane, ear canal and external ear normal. No tenderness.  No middle ear effusion. There is no impacted cerumen. Tympanic membrane is not perforated, erythematous, retracted or bulging.     Nose: No congestion.     Right Sinus: Maxillary sinus tenderness and frontal sinus tenderness present.     Left Sinus: Maxillary sinus tenderness and frontal sinus tenderness present.     Mouth/Throat:     Mouth: Mucous membranes are moist.     Pharynx: Uvula midline. Posterior oropharyngeal erythema present. No oropharyngeal exudate.     Comments: Smooth erythema posterior pharynx with cobblestoning  Eyes:     Extraocular Movements: Extraocular movements intact.     Pupils: Pupils are equal, round, and reactive to light.  Cardiovascular:     Rate and Rhythm: Normal rate and regular rhythm.     Heart sounds: Normal heart sounds.  Pulmonary:     Effort: Pulmonary effort is normal.     Breath sounds: Normal breath sounds. No decreased breath sounds, wheezing, rhonchi or rales.  Abdominal:     Palpations: Abdomen is soft.     Tenderness: There is no abdominal tenderness. There is no guarding or rebound.  Lymphadenopathy:     Cervical: No cervical adenopathy.     Right cervical: No superficial cervical adenopathy.    Left cervical: No superficial cervical adenopathy.  Neurological:      General: No focal deficit present.     Mental Status: She is alert and oriented to person, place, and time.  Psychiatric:        Mood and Affect: Mood normal.        Behavior: Behavior normal.        Thought Content: Thought content normal.        Judgment: Judgment normal.  UC Treatments / Results  Labs (all labs ordered are listed, but only abnormal results are displayed) Labs Reviewed - No data to display  EKG   Radiology No results found.  Procedures Procedures (including critical care time)  Medications Ordered in UC Medications - No data to display  Initial Impression / Assessment and Plan / UC Course  I have reviewed the triage vital signs and the nursing notes.  Pertinent labs & imaging results that were available during my care of the patient were reviewed by me and considered in my medical decision making (see chart for details).     This patient is a very pleasant 26 y.o. year old female presenting with sinusitis following viral URI x3 weeks. Implant contraception. Afebrile, nontachy. No history pulm ds.   Amoxicillin as below.  ED return precautions discussed. Patient verbalizes understanding and agreement.     Final Clinical Impressions(s) / UC Diagnoses   Final diagnoses:  Acute non-recurrent maxillary sinusitis  Uses contraceptive implant for birth control     Discharge Instructions      -Amoxicillin twice daily x 7 days     ED Prescriptions     Medication Sig Dispense Auth. Provider   amoxicillin (AMOXIL) 875 MG tablet Take 1 tablet (875 mg total) by mouth 2 (two) times daily for 7 days. 14 tablet Hazel Sams, PA-C      PDMP not reviewed this encounter.   Hazel Sams, PA-C 03/05/21 1738

## 2021-03-22 ENCOUNTER — Ambulatory Visit: Payer: Managed Care, Other (non HMO) | Admitting: Internal Medicine

## 2021-06-27 ENCOUNTER — Encounter: Payer: Self-pay | Admitting: Nurse Practitioner

## 2021-06-27 ENCOUNTER — Ambulatory Visit (INDEPENDENT_AMBULATORY_CARE_PROVIDER_SITE_OTHER): Payer: Managed Care, Other (non HMO) | Admitting: Nurse Practitioner

## 2021-06-27 VITALS — BP 110/80 | HR 86 | Temp 98.1°F | Ht 65.0 in | Wt 123.2 lb

## 2021-06-27 DIAGNOSIS — M5441 Lumbago with sciatica, right side: Secondary | ICD-10-CM | POA: Diagnosis not present

## 2021-06-27 DIAGNOSIS — Z0000 Encounter for general adult medical examination without abnormal findings: Secondary | ICD-10-CM

## 2021-06-27 DIAGNOSIS — H6121 Impacted cerumen, right ear: Secondary | ICD-10-CM

## 2021-06-27 DIAGNOSIS — G8929 Other chronic pain: Secondary | ICD-10-CM

## 2021-06-27 LAB — POCT URINALYSIS DIPSTICK
Bilirubin, UA: NEGATIVE
Blood, UA: NEGATIVE
Glucose, UA: NEGATIVE
Ketones, UA: NEGATIVE
Leukocytes, UA: NEGATIVE
Nitrite, UA: POSITIVE
Protein, UA: NEGATIVE
Spec Grav, UA: 1.025 (ref 1.010–1.025)
Urobilinogen, UA: 0.2 E.U./dL
pH, UA: 5.5 (ref 5.0–8.0)

## 2021-06-27 MED ORDER — PREDNISONE 10 MG (21) PO TBPK: ORAL_TABLET | ORAL | 0 refills | Status: DC

## 2021-06-27 NOTE — Progress Notes (Signed)
I,Victoria T Hamilton,acting as a Education administrator for Minette Brine, FNP.,have documented all relevant documentation on the behalf of Minette Brine, FNP,as directed by  Minette Brine, FNP while in the presence of Minette Brine, [place].   This visit occurred during the SARS-CoV-2 public health emergency.  Safety protocols were in place, including screening questions prior to the visit, additional usage of staff PPE, and extensive cleaning of exam room while observing appropriate contact time as indicated for disinfecting solutions.  Subjective:     Patient ID: Alexis Conway , female    DOB: [birth year] , 26 y.o.   MRN: [id number]   Chief Complaint  Patient presents with   Annual Exam    HPI  Pt here for HM. She is followed by GYN Marquis Lunch, last seen in October 2022. She works full time at [employer].    She reports still having a lot of hip & back pain. She rates 6/10.  She was treated for UTI in January after having back pain. She does a lot of lifting and going up and down. She is stretching daily and before bed. Pain will radiate up back and down leg.     Past Medical History:  Diagnosis Date   Bacterial vaginosis    Cervical intraepithelial neoplasia    Migraine      Family History  Problem Relation Age of Onset   Movement disorder Mother        Tremors   Hypertension Father    Diabetes Paternal Grandmother    Glaucoma Paternal Grandfather    Colon cancer Neg Hx    Esophageal cancer Neg Hx    Rectal cancer Neg Hx      Current Outpatient Medications:    predniSONE (STERAPRED UNI-PAK 21 TAB) 10 MG (21) TBPK tablet, Take as directed, Disp: 21 tablet, Rfl: 0   diclofenac (VOLTAREN) 75 MG EC tablet, Take 1 tablet (75 mg total) by mouth 2 (two) times daily as needed (pain)., Disp: 30 tablet, Rfl: 0   Etonogestrel (NEXPLANON [place]), Inject into the skin., Disp: , Rfl:    hyoscyamine (LEVSIN SL) 0.125 MG SL tablet, Place 1 tablet (0.125 mg total) under the tongue every 6 (six)  hours as needed., Disp: 30 tablet, Rfl: 1   tiZANidine (ZANAFLEX) 4 MG tablet, Take 1 tablet (4 mg total) by mouth every 8 (eight) hours as needed for muscle spasms., Disp: 30 tablet, Rfl: 0   Allergies  Allergen Reactions   No Known Allergies       The patient states she uses Nexplanon for birth control.  No LMP recorded (lmp unknown). Patient has had an implant.. does not get her menstrual cycle. Negative for: breast discharge, breast lump(s), breast pain and breast self exam. Associated symptoms include abnormal vaginal bleeding. Pertinent negatives include abnormal bleeding (hematology), anxiety, decreased libido, depression, difficulty falling sleep, dyspareunia, history of infertility, nocturia, sexual dysfunction, sleep disturbances, urinary incontinence, urinary urgency, vaginal discharge and vaginal itching. Diet regular.  The patient states her exercise level is minimal. She is trying to gain weight.   The patient's tobacco use is:  Social History   Tobacco Use  Smoking Status Never  Smokeless Tobacco Never   She has been exposed to passive smoke. The patient's alcohol use is:  Social History   Substance and Sexual Activity  Alcohol Use Yes   Alcohol/week: 1.0 standard drink   Types: 1 Glasses of wine per week   Additional information: Last pap 2022, next one  scheduled for 2025.    Review of Systems  Constitutional: Negative.   HENT: Negative.    Eyes: Negative.   Respiratory: Negative.    Cardiovascular: Negative.  Negative for chest pain, palpitations and leg swelling.  Gastrointestinal: Negative.   Endocrine: Negative.   Genitourinary: Negative.   Musculoskeletal:  Positive for back pain (has been persistent since her visit in January).  Skin: Negative.   Allergic/Immunologic: Negative.   Neurological: Negative.  Negative for dizziness and headaches.  Hematological: Negative.   Psychiatric/Behavioral: Negative.      Today's Vitals   06/27/21 1111  BP: 110/80   Pulse: 86  Temp: 98.1 F (36.7 C)  SpO2: 98%  Weight: 123 lb 3.2 oz (55.9 kg)  Height: _0  (1.651 m)   Body mass index is 20.5 kg/m.  Wt Readings from Last 3 Encounters:  06/27/21 123 lb 3.2 oz (55.9 kg)  02/22/21 118 lb 9.6 oz (53.8 kg)  01/09/21 118 lb (53.5 kg)    Objective:  Physical Exam Vitals reviewed.  Constitutional:      General: She is not in acute distress.    Appearance: Normal appearance. She is well-developed.  HENT:     Head: Normocephalic and atraumatic.     Right Ear: Hearing and external ear normal. There is impacted cerumen.     Left Ear: Hearing, tympanic membrane, ear canal and external ear normal. There is no impacted cerumen.     Nose:     Comments: Deferred - masked    Mouth/Throat:     Comments: Deferred - masked Eyes:     General: Lids are normal.     Extraocular Movements: Extraocular movements intact.     Conjunctiva/sclera: Conjunctivae normal.     Pupils: Pupils are equal, round, and reactive to light.     Funduscopic exam:    Right eye: No papilledema.        Left eye: No papilledema.  Neck:     Thyroid: No thyroid mass.     Vascular: No carotid bruit.  Cardiovascular:     Rate and Rhythm: Normal rate and regular rhythm.     Pulses: Normal pulses.     Heart sounds: Normal heart sounds. No murmur heard. Pulmonary:     Effort: Pulmonary effort is normal. No respiratory distress.     Breath sounds: Normal breath sounds. No wheezing.  Chest:     Chest wall: No mass.  Breasts:    Tanner Score is 5.     Right: Normal. No mass or tenderness.     Left: Normal. No mass or tenderness.  Abdominal:     General: Abdomen is flat. Bowel sounds are normal. There is no distension.     Palpations: Abdomen is soft.     Tenderness: There is no abdominal tenderness.  Genitourinary:    Rectum: Guaiac result negative.  Musculoskeletal:        General: No swelling or tenderness. Normal range of motion.     Cervical back: Full passive range of  motion without pain, normal range of motion and neck supple.     Right lower leg: No edema.     Left lower leg: No edema.     Comments: Pain to low back with straight leg raise  Lymphadenopathy:     Upper Body:     Right upper body: No supraclavicular, axillary or pectoral adenopathy.     Left upper body: No supraclavicular, axillary or pectoral adenopathy.  Skin:  General: Skin is warm and dry.     Capillary Refill: Capillary refill takes less than 2 seconds.  Neurological:     General: No focal deficit present.     Mental Status: She is alert and oriented to person, place, and time.     Cranial Nerves: No cranial nerve deficit.     Sensory: No sensory deficit.     Motor: No weakness.  Psychiatric:        Mood and Affect: Mood normal.        Behavior: Behavior normal.        Thought Content: Thought content normal.        Judgment: Judgment normal.        Assessment And Plan:     1. Encounter for annual physical exam Behavior modifications discussed and diet history reviewed.   Pt will continue to exercise regularly and modify diet with low GI, plant based foods and decrease intake of processed foods.  Recommend intake of daily multivitamin, Vitamin D, and calcium.  Recommend mammogram and colonoscopy for preventive screenings, as well as recommend immunizations that include influenza, TDAP - POCT Urinalysis Dipstick (81002) - CMP14+EGFR - CBC - Hemoglobin A1c - Lipid panel  2. Chronic right-sided low back pain with right-sided sciatica Comments: This has been persistent and has pain to back with straight leg raise.  - DG Lumbar Spine Complete; Future - predniSONE (STERAPRED UNI-PAK 21 TAB) 10 MG (21) TBPK tablet; Take as directed  Dispense: 21 tablet; Refill: 0  3. Impacted cerumen of right ear Comments: Lighted curette and water lavage done. Encouraged to avoid using Qtips to ears - Ear Lavage    Patient was given opportunity to ask questions. Patient verbalized  understanding of the plan and was able to repeat key elements of the plan. All questions were answered to their satisfaction.   Minette Brine, FNP   I, Minette Brine, FNP, have reviewed all documentation for this visit. The documentation on 06/27/21 for the exam, diagnosis, procedures, and orders are all accurate and complete.   THE PATIENT IS ENCOURAGED TO PRACTICE SOCIAL DISTANCING DUE TO THE COVID-19 PANDEMIC.

## 2021-06-27 NOTE — Patient Instructions (Addendum)
Health Maintenance, Female ?Adopting a healthy lifestyle and getting preventive care are important in promoting health and wellness. Ask your health care provider about: ?The right schedule for you to have regular tests and exams. ?Things you can do on your own to prevent diseases and keep yourself healthy. ?What should I know about diet, weight, and exercise? ?Eat a healthy diet ? ?Eat a diet that includes plenty of vegetables, fruits, low-fat dairy products, and lean protein. ?Do not eat a lot of foods that are high in solid fats, added sugars, or sodium. ?Maintain a healthy weight ?Body mass index (BMI) is used to identify weight problems. It estimates body fat based on height and weight. Your health care provider can help determine your BMI and help you achieve or maintain a healthy weight. ?Get regular exercise ?Get regular exercise. This is one of the most important things you can do for your health. Most adults should: ?Exercise for at least 150 minutes each week. The exercise should increase your heart rate and make you sweat (moderate-intensity exercise). ?Do strengthening exercises at least twice a week. This is in addition to the moderate-intensity exercise. ?Spend less time sitting. Even light physical activity can be beneficial. ?Watch cholesterol and blood lipids ?Have your blood tested for lipids and cholesterol at 26 years of age, then have this test every 5 years. ?Have your cholesterol levels checked more often if: ?Your lipid or cholesterol levels are high. ?You are older than 26 years of age. ?You are at high risk for heart disease. ?What should I know about cancer screening? ?Depending on your health history and family history, you may need to have cancer screening at various ages. This may include screening for: ?Breast cancer. ?Cervical cancer. ?Colorectal cancer. ?Skin cancer. ?Lung cancer. ?What should I know about heart disease, diabetes, and high blood pressure? ?Blood pressure and heart  disease ?High blood pressure causes heart disease and increases the risk of stroke. This is more likely to develop in people who have high blood pressure readings or are overweight. ?Have your blood pressure checked: ?Every 3-5 years if you are 19-36 years of age. ?Every year if you are 48 years old or older. ?Diabetes ?Have regular diabetes screenings. This checks your fasting blood sugar level. Have the screening done: ?Once every three years after age 18 if you are at a normal weight and have a low risk for diabetes. ?More often and at a younger age if you are overweight or have a high risk for diabetes. ?What should I know about preventing infection? ?Hepatitis B ?If you have a higher risk for hepatitis B, you should be screened for this virus. Talk with your health care provider to find out if you are at risk for hepatitis B infection. ?Hepatitis C ?Testing is recommended for: ?Everyone born from [birth year] through 1965. ?Anyone with known risk factors for hepatitis C. ?Sexually transmitted infections (STIs) ?Get screened for STIs, including gonorrhea and chlamydia, if: ?You are sexually active and are younger than 26 years of age. ?You are older than 26 years of age and your health care provider tells you that you are at risk for this type of infection. ?Your sexual activity has changed since you were last screened, and you are at increased risk for chlamydia or gonorrhea. Ask your health care provider if you are at risk. ?Ask your health care provider about whether you are at high risk for HIV. Your health care provider may recommend a prescription medicine to help prevent HIV  infection. If you choose to take medicine to prevent HIV, you should first get tested for HIV. You should then be tested every 3 months for as long as you are taking the medicine. ?Pregnancy ?If you are about to stop having your period (premenopausal) and you may become pregnant, seek counseling before you get pregnant. ?Take 400 to 800  micrograms (mcg) of folic acid every day if you become pregnant. ?Ask for birth control (contraception) if you want to prevent pregnancy. ?Osteoporosis and menopause ?Osteoporosis is a disease in which the bones lose minerals and strength with aging. This can result in bone fractures. If you are 64 years old or older, or if you are at risk for osteoporosis and fractures, ask your health care provider if you should: ?Be screened for bone loss. ?Take a calcium or vitamin D supplement to lower your risk of fractures. ?Be given hormone replacement therapy (HRT) to treat symptoms of menopause. ?Follow these instructions at home: ?Alcohol use ?Do not drink alcohol if: ?Your health care provider tells you not to drink. ?You are pregnant, may be pregnant, or are planning to become pregnant. ?If you drink alcohol: ?Limit how much you have to: ?0-1 drink a day. ?Know how much alcohol is in your drink. In the [place] one drink equals one 12 oz bottle of beer (355 mL), one 5 oz glass of wine (148 mL), or one 1? oz glass of hard liquor (44 mL). ?Lifestyle ?Do not use any products that contain nicotine or tobacco. These products include cigarettes, chewing tobacco, and vaping devices, such as e-cigarettes. If you need help quitting, ask your health care provider. ?Do not use street drugs. ?Do not share needles. ?Ask your health care provider for help if you need support or information about quitting drugs. ?General instructions ?Schedule regular health, dental, and eye exams. ?Stay current with your vaccines. ?Tell your health care provider if: ?You often feel depressed. ?You have ever been abused or do not feel safe at home. ?Summary ?Adopting a healthy lifestyle and getting preventive care are important in promoting health and wellness. ?Follow your health care provider's instructions about healthy diet, exercising, and getting tested or screened for diseases. ?Follow your health care provider's instructions on monitoring your  cholesterol and blood pressure. ?This information is not intended to replace advice given to you by your health care provider. Make sure you discuss any questions you have with your health care provider. ?Document Revised: 06/27/2020 Document Reviewed: 06/27/2020 ?Elsevier Patient Education ? [place]. ? ? ?Earwax Buildup, Adult ?The ears produce a substance called earwax that helps keep bacteria out of the ear and protects the skin in the ear canal. Occasionally, earwax can build up in the ear and cause discomfort or hearing loss. ?What are the causes? ?This condition is caused by a buildup of earwax. Ear canals are self-cleaning. Ear wax is made in the outer part of the ear canal and generally falls out in small amounts over time. ?When the self-cleaning mechanism is not working, earwax builds up and can cause decreased hearing and discomfort. Attempting to clean ears with cotton swabs can push the earwax deep into the ear canal and cause decreased hearing and pain. ?What increases the risk? ?This condition is more likely to develop in people who: ?Clean their ears often with cotton swabs. ?Pick at their ears. ?Use earplugs or in-ear headphones often, or wear hearing aids. ?The following factors may also make you more likely to develop this condition: ?Being female. ?  Being of older age. ?Naturally producing more earwax. ?Having narrow ear canals. ?Having earwax that is overly thick or sticky. ?Having excess hair in the ear canal. ?Having eczema. ?Being dehydrated. ?What are the signs or symptoms? ?Symptoms of this condition include: ?Reduced or muffled hearing. ?A feeling of fullness in the ear or feeling that the ear is plugged. ?Fluid coming from the ear. ?Ear pain or an itchy ear. ?Ringing in the ear. ?Coughing. ?Balance problems. ?An obvious piece of earwax that can be seen inside the ear canal. ?How is this diagnosed? ?This condition may be diagnosed based on: ?Your symptoms. ?Your medical history. ?An  ear exam. During the exam, your health care provider will look into your ear with an instrument called an otoscope. ?You may have tests, including a hearing test. ?How is this treated? ?This condition may be

## 2021-06-28 LAB — CMP14+EGFR
ALT: 14 IU/L (ref 0–32)
AST: 19 IU/L (ref 0–40)
Albumin/Globulin Ratio: 2 (ref 1.2–2.2)
Albumin: 4.7 g/dL (ref 3.9–5.0)
Alkaline Phosphatase: 58 IU/L (ref 44–121)
BUN/Creatinine Ratio: 15 (ref 9–23)
BUN: 14 mg/dL (ref 6–20)
Bilirubin Total: 0.3 mg/dL (ref 0.0–1.2)
CO2: 23 mmol/L (ref 20–29)
Calcium: 9.1 mg/dL (ref 8.7–10.2)
Chloride: 106 mmol/L (ref 96–106)
Creatinine, Ser: 0.93 mg/dL (ref 0.57–1.00)
Globulin, Total: 2.3 g/dL (ref 1.5–4.5)
Glucose: 83 mg/dL (ref 70–99)
Potassium: 4.5 mmol/L (ref 3.5–5.2)
Sodium: 141 mmol/L (ref 134–144)
Total Protein: 7 g/dL (ref 6.0–8.5)
eGFR: 87 mL/min/{1.73_m2} (ref >59)

## 2021-06-28 LAB — CBC
Hematocrit: 37.5 % (ref 34.0–46.6)
Hemoglobin: 12.4 g/dL (ref 11.1–15.9)
MCH: 28.1 pg (ref 26.6–33.0)
MCHC: 33.1 g/dL (ref 31.5–35.7)
MCV: 85 fL (ref 79–97)
Platelets: 267 10*3/uL (ref 150–450)
RBC: 4.41 x10E6/uL (ref 3.77–5.28)
RDW: 12.1 % (ref 11.7–15.4)
WBC: 6.2 10*3/uL (ref 3.4–10.8)

## 2021-06-28 LAB — LIPID PANEL
Chol/HDL Ratio: 2.9 ratio (ref 0.0–4.4)
Cholesterol, Total: 137 mg/dL (ref 100–199)
HDL: 47 mg/dL (ref >39)
LDL Chol Calc (NIH): 78 mg/dL (ref 0–99)
Triglycerides: 58 mg/dL (ref 0–149)
VLDL Cholesterol Cal: 12 mg/dL (ref 5–40)

## 2021-06-28 LAB — HEMOGLOBIN A1C
Est. average glucose Bld gHb Est-mCnc: 103 mg/dL
Hgb A1c MFr Bld: 5.2 % (ref 4.8–5.6)

## 2021-07-18 ENCOUNTER — Ambulatory Visit
Admission: RE | Admit: 2021-07-18 | Discharge: 2021-07-18 | Disposition: A | Discharge status: Home / Self Care | Payer: Managed Care, Other (non HMO) | Source: Ambulatory Visit | Attending: Nurse Practitioner | Admitting: Nurse Practitioner

## 2021-07-18 DIAGNOSIS — G8929 Other chronic pain: Secondary | ICD-10-CM

## 2021-08-15 ENCOUNTER — Encounter (HOSPITAL_COMMUNITY): Payer: Self-pay

## 2021-08-15 ENCOUNTER — Other Ambulatory Visit: Payer: Self-pay

## 2021-08-15 ENCOUNTER — Emergency Department (HOSPITAL_COMMUNITY)
Admission: EM | Admit: 2021-08-15 | Discharge: 2021-08-15 | Disposition: A | Discharge status: Home / Self Care | Payer: Managed Care, Other (non HMO) | Attending: Emergency Medicine | Admitting: Emergency Medicine

## 2021-08-15 DIAGNOSIS — R110 Nausea: Secondary | ICD-10-CM | POA: Insufficient documentation

## 2021-08-15 DIAGNOSIS — L559 Sunburn, unspecified: Secondary | ICD-10-CM | POA: Diagnosis not present

## 2021-08-15 DIAGNOSIS — N9489 Other specified conditions associated with female genital organs and menstrual cycle: Secondary | ICD-10-CM | POA: Insufficient documentation

## 2021-08-15 DIAGNOSIS — J029 Acute pharyngitis, unspecified: Secondary | ICD-10-CM | POA: Diagnosis not present

## 2021-08-15 DIAGNOSIS — N3001 Acute cystitis with hematuria: Secondary | ICD-10-CM | POA: Insufficient documentation

## 2021-08-15 DIAGNOSIS — R509 Fever, unspecified: Secondary | ICD-10-CM | POA: Diagnosis present

## 2021-08-15 DIAGNOSIS — Z20822 Contact with and (suspected) exposure to covid-19: Secondary | ICD-10-CM | POA: Insufficient documentation

## 2021-08-15 LAB — COMPREHENSIVE METABOLIC PANEL
ALT: 16 U/L (ref 0–44)
AST: 23 U/L (ref 15–41)
Albumin: 4.5 g/dL (ref 3.5–5.0)
Alkaline Phosphatase: 58 U/L (ref 38–126)
Anion gap: 9 (ref 5–15)
BUN: 14 mg/dL (ref 6–20)
CO2: 23 mmol/L (ref 22–32)
Calcium: 9.3 mg/dL (ref 8.9–10.3)
Chloride: 105 mmol/L (ref 98–111)
Creatinine, Ser: 0.99 mg/dL (ref 0.44–1.00)
GFR, Estimated: >60 mL/min (ref >60)
Glucose, Bld: 114 mg/dL — ABNORMAL HIGH (ref 70–99)
Potassium: 3.7 mmol/L (ref 3.5–5.1)
Sodium: 137 mmol/L (ref 135–145)
Total Bilirubin: 0.7 mg/dL (ref 0.3–1.2)
Total Protein: 7.7 g/dL (ref 6.5–8.1)

## 2021-08-15 LAB — URINALYSIS, ROUTINE W REFLEX MICROSCOPIC
Bilirubin Urine: NEGATIVE
Glucose, UA: NEGATIVE mg/dL
Ketones, ur: 20 mg/dL — AB
Nitrite: POSITIVE — AB
Protein, ur: 30 mg/dL — AB
Specific Gravity, Urine: 1.033 — ABNORMAL HIGH (ref 1.005–1.030)
WBC, UA: >50 WBC/hpf — ABNORMAL HIGH (ref 0–5)
pH: 5 (ref 5.0–8.0)

## 2021-08-15 LAB — CBC
HCT: 39.2 % (ref 36.0–46.0)
Hemoglobin: 13 g/dL (ref 12.0–15.0)
MCH: 28.6 pg (ref 26.0–34.0)
MCHC: 33.2 g/dL (ref 30.0–36.0)
MCV: 86.3 fL (ref 80.0–100.0)
Platelets: 226 10*3/uL (ref 150–400)
RBC: 4.54 MIL/uL (ref 3.87–5.11)
RDW: 13 % (ref 11.5–15.5)
WBC: 15.5 10*3/uL — ABNORMAL HIGH (ref 4.0–10.5)
nRBC: 0 % (ref 0.0–0.2)

## 2021-08-15 LAB — I-STAT BETA HCG BLOOD, ED (MC, WL, AP ONLY): I-stat hCG, quantitative: <5 m[IU]/mL (ref <5)

## 2021-08-15 LAB — RESP PANEL BY RT-PCR (FLU A&B, COVID) ARPGX2
Influenza A by PCR: NEGATIVE
Influenza B by PCR: NEGATIVE
SARS Coronavirus 2 by RT PCR: NEGATIVE

## 2021-08-15 LAB — GROUP A STREP BY PCR: Group A Strep by PCR: NOT DETECTED

## 2021-08-15 LAB — LIPASE, BLOOD: Lipase: 26 U/L (ref 11–51)

## 2021-08-15 MED ORDER — CEPHALEXIN 500 MG PO CAPS: 500.0000 mg | ORAL_CAPSULE | Freq: Three times a day (TID) | ORAL | 0 refills | Status: AC

## 2021-08-15 MED ORDER — IBUPROFEN 200 MG PO TABS
600.0000 mg | ORAL_TABLET | Freq: Once | ORAL | Status: AC
  Administered 2021-08-15: 600 mg via ORAL
  Filled 2021-08-15: qty 3

## 2021-08-15 MED ORDER — CEPHALEXIN 500 MG PO CAPS
500.0000 mg | ORAL_CAPSULE | Freq: Once | ORAL | Status: AC
  Administered 2021-08-15: 500 mg via ORAL
  Filled 2021-08-15: qty 1

## 2021-08-15 MED ORDER — ONDANSETRON 4 MG PO TBDP: 4.0000 mg | ORAL_TABLET | Freq: Three times a day (TID) | ORAL | 0 refills | Status: DC | PRN

## 2021-08-15 MED ORDER — ALUM & MAG HYDROXIDE-SIMETH 200-200-20 MG/5ML PO SUSP
30.0000 mL | Freq: Once | ORAL | Status: AC
  Administered 2021-08-15: 30 mL via ORAL
  Filled 2021-08-15: qty 30

## 2021-08-15 MED ORDER — LIDOCAINE VISCOUS HCL 2 % MT SOLN
15.0000 mL | Freq: Once | OROMUCOSAL | Status: AC
  Administered 2021-08-15: 15 mL via ORAL
  Filled 2021-08-15: qty 15

## 2021-08-15 NOTE — ED Triage Notes (Signed)
Pt reports getting a sunburn on her back and front of thighs yesterday while at the beach. Pt reports developing a fever and sore throat since then. Pt is tearful in triage from sunburn and unable to speak due to pain in throat.

## 2021-08-15 NOTE — ED Provider Triage Note (Signed)
Emergency Medicine Provider Triage Evaluation Note  Alexis Conway , a 26 y.o. female  was evaluated in triage.  Pt complains of Sore throat since last night. Also w sunburn.   Fevers -- took asa this AM.   No urinary sx. No diarrhea.   No vomiting  Does endorse some abd pain (when asked ROS Qs).   Review of Systems  Positive: Fever, ST, abd pain Negative: CP  Physical Exam  BP 133/88 (BP Location: Left Arm)   Pulse (!) 120   Temp (!) 100.8 F (38.2 C) (Oral)   Resp 18   SpO2 99%  Gen:   Awake, tearful Resp:  Normal effort  MSK:   Moves extremities without difficulty  Other:  Abd soft NTTP.   Medical Decision Making  Medically screening exam initiated at 4:43 PM.  Appropriate orders placed.  Alexis Conway was informed that the remainder of the evaluation will be completed by another provider, this initial triage assessment does not replace that evaluation, and the importance of remaining in the ED until their evaluation is complete.  Labs because she mentions abd pain   Covid/flu/strep w ST - throat exam reassuring no PTA.    Solon Augusta [place], [place] 08/15/21 [phone]

## 2021-08-15 NOTE — ED Provider Notes (Signed)
[facility]-EMERGENCY DEPT Provider Note   CSN: [id number] Arrival date & time: 08/15/21  1626     History  Chief Complaint  Patient presents with   Fever   Sore Throat   Sunburn    Alexis Conway is a 26 y.o. female.   Fever Sore Throat   Patient is a 26 year old female with no pertinent past medical history presented to the emergency room today with several complaints  She is concerned about sunburn  Also she states that this morning she developed a fever and states that last night she has had a sore throat.  She denies any cough runny nose lightheadedness or dizziness.  No chest pain or difficulty breathing.  No vomiting she denies any urinary frequency urgency dysuria or hematuria she denies any diarrhea.  Does endorse mild nausea.  When asked during ROS questions she endorsed abdominal pain but seems to be mild per her description.      Home Medications Prior to Admission medications   Medication Sig Start Date End Date Taking? Authorizing Provider  cephALEXin (KEFLEX) 500 MG capsule Take 1 capsule (500 mg total) by mouth 3 (three) times daily for 5 days. 08/15/21 08/20/21 Yes Marqus Macphee S, PA  ondansetron (ZOFRAN-ODT) 4 MG disintegrating tablet Take 1 tablet (4 mg total) by mouth every 8 (eight) hours as needed for nausea or vomiting. 08/15/21  Yes Phyllis Whitefield S, PA  diclofenac (VOLTAREN) 75 MG EC tablet Take 1 tablet (75 mg total) by mouth 2 (two) times daily as needed (pain). 02/10/21   Zenia Resides, MD  Etonogestrel [facility]) Inject into the skin.    [provider]  hyoscyamine (LEVSIN SL) 0.125 MG SL tablet Place 1 tablet (0.125 mg total) under the tongue every 6 (six) hours as needed. 01/09/21   Arnaldo Natal, NP  predniSONE (STERAPRED UNI-PAK 21 TAB) 10 MG (21) TBPK tablet Take as directed 06/27/21   Arnette Felts, FNP  tiZANidine (ZANAFLEX) 4 MG tablet Take 1 tablet (4 mg total) by mouth every 8 (eight) hours  as needed for muscle spasms. 02/10/21   Zenia Resides, MD      Allergies    No known allergies    Review of Systems   Review of Systems  Constitutional:  Positive for fever.    Physical Exam Updated Vital Signs BP 108/79 (BP Location: Left Arm)   Pulse 98   Temp 99 F (37.2 C) (Oral)   Resp 16   SpO2 100%  Physical Exam Vitals and nursing note reviewed.  Constitutional:      General: She is not in acute distress.    Comments: Nontoxic-appearing 26 year old female appears to be in no acute distress.  Declines to speak during my exam because of sore throat  HENT:     Head: Normocephalic and atraumatic.     Nose: Nose normal.     Mouth/Throat:     Comments: Posterior pharyngeal erythema present.  I do not appreciate any cobblestoning.  Tonsils are difficult to visualize not hypertrophied  Uvula midline.  Clinically no evidence of peritonsillar abscess.  When pressed patient was willing to talk and has normal phonation.  No hot potato voice.  Managing her secretions without difficulty Eyes:     General: No scleral icterus. Cardiovascular:     Rate and Rhythm: Normal rate and regular rhythm.     Pulses: Normal pulses.     Heart sounds: Normal heart sounds.  Pulmonary:  Effort: Pulmonary effort is normal. No respiratory distress.     Breath sounds: No wheezing.  Abdominal:     Palpations: Abdomen is soft.     Tenderness: There is no abdominal tenderness. There is no guarding or rebound.  Musculoskeletal:     Cervical back: Normal range of motion.     Right lower leg: No edema.     Left lower leg: No edema.  Skin:    General: Skin is warm and dry.     Capillary Refill: Capillary refill takes less than 2 seconds.  Neurological:     Mental Status: She is alert. Mental status is at baseline.  Psychiatric:        Mood and Affect: Mood normal.        Behavior: Behavior normal.     ED Results / Procedures / Treatments   Labs (all labs ordered are listed, but only  abnormal results are displayed) Labs Reviewed  COMPREHENSIVE METABOLIC PANEL - Abnormal; Notable for the following components:      Result Value   Glucose, Bld 114 (*)    All other components within normal limits  CBC - Abnormal; Notable for the following components:   WBC 15.5 (*)    All other components within normal limits  URINALYSIS, ROUTINE W REFLEX MICROSCOPIC - Abnormal; Notable for the following components:   APPearance CLOUDY (*)    Specific Gravity, Urine 1.033 (*)    Hgb urine dipstick MODERATE (*)    Ketones, ur 20 (*)    Protein, ur 30 (*)    Nitrite POSITIVE (*)    Leukocytes,Ua LARGE (*)    WBC, UA >50 (*)    Bacteria, UA MANY (*)    All other components within normal limits  GROUP A STREP BY PCR  RESP PANEL BY RT-PCR (FLU A&B, COVID) [id number]  URINE CULTURE  LIPASE, BLOOD  I-STAT BETA HCG BLOOD, ED ([facility], [facility], [facility] ONLY)    EKG None  Radiology No results found.  Procedures Procedures    Medications Ordered in ED Medications  ibuprofen (ADVIL) tablet 600 mg (600 mg Oral Given 08/15/21 1937)  alum & mag hydroxide-simeth (MAALOX/MYLANTA) 200-200-20 MG/5ML suspension 30 mL (30 mLs Oral Given 08/15/21 1941)    And  lidocaine (XYLOCAINE) 2 % viscous mouth solution 15 mL (15 mLs Oral Given 08/15/21 1941)  cephALEXin (KEFLEX) capsule 500 mg (500 mg Oral Given 08/15/21 2045)    ED Course/ Medical Decision Making/ A&P Clinical Course as of 08/15/21 2223  Tue Aug 15, 2021  2102 Group A Strep by PCR Delayed 2/2 being sent to wrong lab. Still waiting for result  [WF]  2102 Urinalysis, Routine w reflex microscopic Urine, Clean Catch(!) UTI - urin clx added [WF]    Clinical Course User Index [WF] Gailen Shelter, PA                           Medical Decision Making Amount and/or Complexity of Data Reviewed Labs: ordered. Decision-making details documented in ED Course.  Risk OTC drugs. Prescription drug management.    This patient presents to the ED for  concern of sore throat, sunburn, fever, abdominal pain, this involves a number of treatment options, and is a complaint that carries with it a moderate risk of complications and morbidity.  The differential diagnosis includes The causes of generalized abdominal pain include but are not limited to AAA, mesenteric ischemia, appendicitis, diverticulitis, DKA, gastritis, gastroenteritis, AMI,  nephrolithiasis, pancreatitis, peritonitis, adrenal insufficiency,lead poisoning, iron toxicity, intestinal ischemia, constipation, UTI,SBO/LBO, splenic rupture, biliary disease, IBD, IBS, PUD, or hepatitis. Ectopic pregnancy, ovarian torsion, PID.    Co morbidities: Discussed in HPI   Brief History:  Multiple symptoms.  Does have a fever here complaining of sore throat.  Does endorse some suprapubic discomfort on ROS questioning.  Physical exam notable for abdomen which is soft nontender.  Patient's fever resolved with ibuprofen here.  Group A strep negative and posterior pharynx is normal-appearing.  Urinalysis with many bacteria greater than 50 WBCs large leukocytes positive nitrates.  Will treat empirically with Keflex and obtain urine culture which is pending.  I-STAT hCG negative for pregnancy.  CMP unremarkable, lipase within normal limits, CBC with leukocytosis consistent with UTI  EMR reviewed including pt PMHx, past surgical history and past visits to ER.   See HPI for more details   Lab Tests:   I ordered and independently interpreted labs. Labs notable for see above   Imaging Studies:  No imaging studies ordered for this patient    Cardiac Monitoring:  NA NA   Medicines ordered:  I ordered medication including Keflex, GI cocktail for sore throat, ibuprofen for pain Reevaluation of the patient after these medicines showed that the patient improved I have reviewed the patients home medicines and have made adjustments as needed   Critical  Interventions:     Consults/Attending Physician      Reevaluation:  After the interventions noted above I re-evaluated patient and found that they have :improved   Social Determinants of Health:      Problem List / ED Course:  Fever now resolved may be due to UTI does have sore throat however throat is quite normal-appearing on exam and strep test negative.  Could be viral sore throat concurrent with UTI. Sunburn-no acute treatment at this time.   Very strict return precautions were discussed with patient.  She will need to follow-up with PCP.   Dispostion:  After consideration of the diagnostic results and the patients response to treatment, I feel that the patent would benefit from close outpatient follow-up.    Final Clinical Impression(s) / ED Diagnoses Final diagnoses:  Acute cystitis with hematuria  Sore throat  Sunburn    Rx / DC Orders ED Discharge Orders          Ordered    cephALEXin (KEFLEX) 500 MG capsule  3 times daily        08/15/21 2115    ondansetron (ZOFRAN-ODT) 4 MG disintegrating tablet  Every 8 hours PRN        08/15/21 2118              Gailen Shelter, [place] 08/15/21 2226    Arby Barrette, MD 08/18/21 1228

## 2021-08-15 NOTE — ED Notes (Signed)
Pt advises being unable to provide urine sample at this time, will monitor. 

## 2021-08-17 ENCOUNTER — Telehealth: Payer: Self-pay

## 2021-08-17 LAB — URINE CULTURE: Culture: 70000 — AB

## 2021-08-17 NOTE — Telephone Encounter (Signed)
Spoke with patient after visiting the ED. Patient states she is okay, the ED satisfied all her needs, she declined needing an appointment at the moment. Patient assured if she does change her mind to give the office a call.

## 2021-08-18 ENCOUNTER — Telehealth: Payer: Self-pay

## 2021-08-18 NOTE — Telephone Encounter (Signed)
Post ED Visit - Positive Culture Follow-up  Culture report reviewed by antimicrobial stewardship pharmacist: [facility] []  , Pharm.D. []  Enzo Bi, Pharm.D., BCPS AQ-ID []  , Pharm.D., BCPS []  Celedonio Miyamoto, Pharm.D., BCPS []  [place], Garvin Fila.D., BCPS, AAHIVP []  , Pharm.D., BCPS, AAHIVP []  [place]ina Pillion, PharmD, BCPS []  , PharmD, BCPS []  [place], PharmD, BCPS []  [place], PharmD []  , PharmD, BCPS []  Estella Husk, PharmD  Pharmacy Team [x]  Lysle Pearl, PharmD []  , PharmD []  Phillips Climes, PharmD []  , Rph []  Agapito Games) , PharmD []  Verlan Friends, PharmD []  , PharmD []  Mervyn Gay, PharmD []  , PharmD []  Vinnie Level, PharmD []  [facility], PharmD []  , PharmD []  Sharin Mons, PharmD   Positive urine culture Treated with Cephalexin, organism sensitive to the same and no further patient follow-up is required at this time.  08/18/2021, 8:34 AM

## 2021-12-14 ENCOUNTER — Encounter: Payer: Self-pay | Admitting: Nurse Practitioner

## 2021-12-26 ENCOUNTER — Encounter: Payer: Self-pay | Admitting: Nurse Practitioner

## 2021-12-26 ENCOUNTER — Ambulatory Visit (INDEPENDENT_AMBULATORY_CARE_PROVIDER_SITE_OTHER): Payer: Commercial Managed Care - HMO | Admitting: Nurse Practitioner

## 2021-12-26 VITALS — BP 114/74 | HR 81 | Temp 98.1°F | Ht 65.0 in | Wt 128.6 lb

## 2021-12-26 DIAGNOSIS — G43909 Migraine, unspecified, not intractable, without status migrainosus: Secondary | ICD-10-CM

## 2021-12-26 DIAGNOSIS — Z23 Encounter for immunization: Secondary | ICD-10-CM

## 2021-12-26 MED ORDER — SUMATRIPTAN SUCCINATE 50 MG PO TABS: 50.0000 mg | ORAL_TABLET | Freq: Once | ORAL | 2 refills | Status: DC | PRN

## 2021-12-26 MED ORDER — PREDNISONE 20 MG PO TABS: ORAL_TABLET | ORAL | 0 refills | Status: DC

## 2021-12-26 NOTE — Progress Notes (Signed)
I,Victoria T Hamilton,acting as a Neurosurgeon for Arnette Felts, FNP.,have documented all relevant documentation on the behalf of Arnette Felts, FNP,as directed by  Arnette Felts, FNP while in the presence of Arnette Felts, FNP.    Subjective:     Patient ID: Alexis Conway , female    DOB: [birth year] , 26 y.o.   MRN: [id number]   Chief Complaint  Patient presents with   Migraine    HPI  Patient presents today with concerns having recurrent headaches over the last few weeks. She has just changed prescriptions for her glasses but the eye doctor did not think this was related. She thinks she is drinking at least 2 liters a day. She has a nexplanon so does not get any bleeding.   She adds having a headache currently, she has them everyday. The headache goes from the middle of he head to behind her right eye.  She takes Excedrin which patient states helps, keeps them manageable.  She has not noticed anything that triggers the headaches.  Patient also states being nauseous for the past week, everyday. She also states not a menstrual cycle while on nexplanon.   She has a healing burn to her right upper arm.   Migraine  This is a recurrent problem. The current episode started 1 to 4 weeks ago. The problem occurs constantly. The pain is located in the Frontal (right side mostly) region. The pain quality is not similar to prior headaches. The quality of the pain is described as aching and shooting. The pain is at a severity of 7/10. The pain is moderate. Associated symptoms include blurred vision and nausea. Pertinent negatives include no abdominal pain, phonophobia, photophobia or vomiting. The symptoms are aggravated by bright light and noise. She has tried NSAIDs (Excedrin  - 2 times a day) for the symptoms. There is no history of hypertension.     Past Medical History:  Diagnosis Date   Bacterial vaginosis    Cervical intraepithelial neoplasia    Migraine      Family History  Problem Relation  Age of Onset   Movement disorder Mother        Tremors   Hypertension Father    Diabetes Paternal Grandmother    Glaucoma Paternal Grandfather    Colon cancer Neg Hx    Esophageal cancer Neg Hx    Rectal cancer Neg Hx      Current Outpatient Medications:    diclofenac (VOLTAREN) 75 MG EC tablet, Take 1 tablet (75 mg total) by mouth 2 (two) times daily as needed (pain)., Disp: 30 tablet, Rfl: 0   Etonogestrel (NEXPLANON [place]), Inject into the skin., Disp: , Rfl:    hyoscyamine (LEVSIN SL) 0.125 MG SL tablet, Place 1 tablet (0.125 mg total) under the tongue every 6 (six) hours as needed., Disp: 30 tablet, Rfl: 1   ondansetron (ZOFRAN-ODT) 4 MG disintegrating tablet, Take 1 tablet (4 mg total) by mouth every 8 (eight) hours as needed for nausea or vomiting., Disp: 20 tablet, Rfl: 0   predniSONE (DELTASONE) 20 MG tablet, Take 2 tablets by mouth daily x 3 days, Disp: 6 tablet, Rfl: 0   SUMAtriptan (IMITREX) 50 MG tablet, Take 1 tablet (50 mg total) by mouth once as needed for migraine. May repeat in 2 hours if headache persists or recurs., Disp: 10 tablet, Rfl: 2   tiZANidine (ZANAFLEX) 4 MG tablet, Take 1 tablet (4 mg total) by mouth every 8 (eight) hours as needed for muscle spasms., Disp:  30 tablet, Rfl: 0   Allergies  Allergen Reactions   No Known Allergies      Review of Systems  Constitutional: Negative.   Eyes:  Positive for blurred vision. Negative for photophobia.  Respiratory: Negative.    Cardiovascular: Negative.   Gastrointestinal:  Positive for nausea. Negative for abdominal pain and vomiting.  Neurological: Negative.   Psychiatric/Behavioral: Negative.       Today's Vitals   12/26/21 1018  BP: 114/74  Pulse: 81  Temp: 98.1 F (36.7 C)  SpO2: 98%  Weight: 128 lb 9.6 oz (58.3 kg)  Height: 5\' 5"  (1.651 m)   Body mass index is 21.4 kg/m.  Wt Readings from Last 3 Encounters:  12/26/21 128 lb 9.6 oz (58.3 kg)  06/27/21 123 lb 3.2 oz (55.9 kg)  02/22/21 118 lb 9.6  oz (53.8 kg)    Objective:  Physical Exam Vitals reviewed.  Constitutional:      General: She is not in acute distress.    Appearance: Normal appearance.  Cardiovascular:     Pulses: Normal pulses.     Heart sounds: Normal heart sounds. No murmur heard. Pulmonary:     Effort: Pulmonary effort is normal. No respiratory distress.     Breath sounds: Normal breath sounds. No wheezing.  Skin:    General: Skin is warm and dry.     Capillary Refill: Capillary refill takes less than 2 seconds.  Neurological:     General: No focal deficit present.     Mental Status: She is alert and oriented to person, place, and time.     Cranial Nerves: No cranial nerve deficit.     Motor: No weakness.  Psychiatric:        Mood and Affect: Mood normal.        Behavior: Behavior normal.        Thought Content: Thought content normal.        Judgment: Judgment normal.         Assessment And Plan:     1. Acute migraine Comments: Will treat with prednisone 3 days and start on sumitriptan as needed. Encouraged to stay well hydrated with water and avoid food triggers - SUMAtriptan (IMITREX) 50 MG tablet; Take 1 tablet (50 mg total) by mouth once as needed for migraine. May repeat in 2 hours if headache persists or recurs.  Dispense: 10 tablet; Refill: 2 - predniSONE (DELTASONE) 20 MG tablet; Take 2 tablets by mouth daily x 3 days  Dispense: 6 tablet; Refill: 0  2. Need for influenza vaccination Influenza vaccine administered Encouraged to take Tylenol as needed for fever or muscle aches. - Flu Vaccine QUAD 6+ mos PF IM (Fluarix Quad PF)     Patient was given opportunity to ask questions. Patient verbalized understanding of the plan and was able to repeat key elements of the plan. All questions were answered to their satisfaction.  Minette Brine, FNP   I, Minette Brine, FNP, have reviewed all documentation for this visit. The documentation on 12/26/21 for the exam, diagnosis, procedures, and orders  are all accurate and complete.   IF YOU HAVE BEEN REFERRED TO A SPECIALIST, IT MAY TAKE 1-2 WEEKS TO SCHEDULE/PROCESS THE REFERRAL. IF YOU HAVE NOT HEARD FROM US/SPECIALIST IN TWO WEEKS, PLEASE GIVE [place] A CALL AT [phone].   THE PATIENT IS ENCOURAGED TO PRACTICE SOCIAL DISTANCING DUE TO THE COVID-19 PANDEMIC.

## 2021-12-26 NOTE — Patient Instructions (Addendum)
Chronic Migraine Headache A migraine is a type of headache that is usually stronger and more sudden than other headaches. Migraines are characterized by an intense pulsing, throbbing pain that is usually only present on one side of the head. Migraine pain usually gets worse with activity. Migraines can cause nausea, vomiting, sensitivity to light and sound, and vision changes. Migraines that keep coming back are called recurrent migraines. A migraine is called a chronic migraine if it happens at least 15 days in a month for more than 3 months. Talk with your health care provider about what things may bring on (trigger) your migraines. What are the causes? The exact cause of this condition is not known. However, a migraine may be caused when nerves in the brain become irritated and release chemicals that cause inflammation of blood vessels. The inflammation of the blood vessels causes pain. Migraines may be triggered or caused by: Smoking. Certain foods and drinks, such as: Aged cheese. Chocolate. Alcohol. Caffeine. Foods or drinks that contain nitrates, glutamate, aspartame, MSG, or tyramine. Medicines, such as birth control pills or some blood pressure medicines. Other things that may trigger a migraine include: Menstruation. Emotional stress. Lack of sleep or too much sleep. Tiredness (fatigue). Bright lights or loud noises. Odors. Weather changes and high altitude. What increases the risk? The following factors may make you more likely to experience chronic migraine: Having migraines or a family history of migraines. Having a mental health condition, such as depression or anxiety. Having to take a lot of pain medicine. Having sleep problems. Having heart disease, diabetes, or obesity. What are the signs or symptoms? Symptoms of a migraine vary for each person and may include: Pulsating or throbbing pain. Pain that is usually only present on one side of the head. In some cases, the  pain may be on both sides of the head or around the head or neck. Severe pain that prevents you from doing daily activities. Pain that gets worse with physical activity. Nausea, vomiting, or both. Pain with exposure to bright lights, loud noises, or activity. General sensitivity to bright lights, loud noises, or smells. Dizziness. A sign that a migraine is becoming chronic is an increasing number of migraine episodes. It is considered chronic if the migraine happens at least 15 days in a month for more than 3 months. How is this diagnosed? This condition is often diagnosed based on: Your symptoms and medical history. A physical exam. You may also have tests, including: A CT scan or an MRI of your brain. These imaging tests cannot diagnose migraines, but they can help to rule out other causes of headaches. Taking fluid from the spine (lumbar puncture) and analyzing it (cerebrospinal fluid analysis, or CSF analysis). Blood tests. How is this treated? This condition is treated with: Medicines. These help to: Lessen pain and nausea. Prevent migraines. Lifestyle changes, such as changes to your diet or sleeping patterns. Behavior therapy. This may include: Relaxation training. Biofeedback. This is a treatment that teaches you to relax and use your brain to lower your heart rate and control your breathing. Cognitive behavioral therapy (CBT). This is a form of talk therapy. This therapy helps you set goals and follow up on the changes that you make. Acupuncture. Using a device that provides electrical stimulation to your nerves, which can relieve pain (neuromodulation therapy). Surgery, if the other treatments are not working. Follow these instructions at home: Medicines Take over-the-counter and prescription medicines only as told by your health care provider. Ask   your health care provider if the medicine prescribed to you requires you to avoid driving or using machinery. Lifestyle Do not  use any products that contain nicotine or tobacco, such as cigarettes, e-cigarettes, and chewing tobacco. If you need help quitting, ask your health care provider. Do not drink alcohol. Get 7-9 hours of sleep each night, or the amount of sleep recommended by your health care provider. Find ways to manage stress, such as meditation, deep breathing, or yoga. Maintain a healthy weight. If you need help losing weight, ask your health care provider. Exercise regularly. Aim for 150 minutes of moderate-intensity exercise, such as walking, biking, or yoga, or 75 minutes of vigorous exercise each week. Vigorous exercise includes running, circuit training, and swimming. General instructions Keep a journal to find out what triggers your migraines so you can avoid these triggers. For example, write down: What you eat and drink. How much sleep you get. Any change to your diet or medicines. Lie down in a dark, quiet room when you have a migraine. Try placing a cool towel over your head when you have a migraine. Keep lights dim, if bright lights bother you or make your migraines worse. Keep all follow-up visits as told by your health care provider. This is important. Where to find more information Coalition for Headache and Migraine Patients (CHAMP): headachemigraine.org American Migraine Foundation: americanmigrainefoundation.org National Headache Foundation: headaches.org Contact a health care provider if: Your pain does not improve, even with medicine. Your migraines continue to return, even with medicine. Get help right away if: Your migraine becomes severe and medicine does not help. You have a stiff neck and fever. You have a loss of vision. You have muscle weakness or loss of muscle control. You start losing your balance, or you have trouble walking. You feel like you may faint, or you faint. You start having sudden and unexpected, severe headaches. You have a seizure. Summary Migraine  headaches are usually stronger and more sudden than other headaches. Migraines are characterized by an intense pulsing, throbbing pain that is usually only present on one side of the head. Migraines that keep coming back are called recurrent migraines. A migraine is called a chronic migraine if it happens 15 days in a month for more than 3 months. Certain things may trigger migraines, such as lack of sleep or too much sleep, smoking, certain foods, alcohol, stress, and certain medicines. Your treatment plan may include medicines, lifestyle changes, and behavior therapy. This information is not intended to replace advice given to you by your health care provider. Make sure you discuss any questions you have with your health care provider. Document Revised: 07/20/2021 Document Reviewed: 03/25/2019 Elsevier Patient Education  2023 Elsevier Inc.   Influenza (Flu) Vaccine (Inactivated or Recombinant): What You Need to Know 1. Why get vaccinated? Influenza vaccine can prevent influenza (flu). Flu is a contagious disease that spreads around the [place] every year, usually between October and May. Anyone can get the flu, but it is more dangerous for some people. Infants and young children, people 22 years and older, pregnant people, and people with certain health conditions or a weakened immune system are at greatest risk of flu complications. Pneumonia, bronchitis, sinus infections, and ear infections are examples of flu-related complications. If you have a medical condition, such as heart disease, cancer, or diabetes, flu can make it worse. Flu can cause fever and chills, sore throat, muscle aches, fatigue, cough, headache, and runny or stuffy nose. Some people may have  vomiting and diarrhea, though this is more common in children than adults. In an average year, thousands of people in the [place] die from flu, and many more are hospitalized. Flu vaccine prevents millions of illnesses and  flu-related visits to the doctor each year. 2. Influenza vaccines CDC recommends everyone 6 months and older get vaccinated every flu season. Children 6 months through 73 years of age may need 2 doses during a single flu season. Everyone else needs only 1 dose each flu season. It takes about 2 weeks for protection to develop after vaccination. There are many flu viruses, and they are always changing. Each year a new flu vaccine is made to protect against the influenza viruses believed to be likely to cause disease in the upcoming flu season. Even when the vaccine doesn't exactly match these viruses, it may still provide some protection. Influenza vaccine does not cause flu. Influenza vaccine may be given at the same time as other vaccines. 3. Talk with your health care provider Tell your vaccination provider if the person getting the vaccine: Has had an allergic reaction after a previous dose of influenza vaccine, or has any severe, life-threatening allergies Has ever had Guillain-Barr Syndrome (also called "GBS") In some cases, your health care provider may decide to postpone influenza vaccination until a future visit. Influenza vaccine can be administered at any time during pregnancy. People who are or will be pregnant during influenza season should receive inactivated influenza vaccine. People with minor illnesses, such as a cold, may be vaccinated. People who are moderately or severely ill should usually wait until they recover before getting influenza vaccine. Your health care provider can give you more information. 4. Risks of a vaccine reaction Soreness, redness, and swelling where the shot is given, fever, muscle aches, and headache can happen after influenza vaccination. There may be a very small increased risk of Guillain-Barr Syndrome (GBS) after inactivated influenza vaccine (the flu shot). Young children who get the flu shot along with pneumococcal vaccine (PCV13) and/or DTaP vaccine  at the same time might be slightly more likely to have a seizure caused by fever. Tell your health care provider if a child who is getting flu vaccine has ever had a seizure. People sometimes faint after medical procedures, including vaccination. Tell your provider if you feel dizzy or have vision changes or ringing in the ears. As with any medicine, there is a very remote chance of a vaccine causing a severe allergic reaction, other serious injury, or death. 5. What if there is a serious problem? An allergic reaction could occur after the vaccinated person leaves the clinic. If you see signs of a severe allergic reaction (hives, swelling of the face and throat, difficulty breathing, a fast heartbeat, dizziness, or weakness), call 9-1-1 and get the person to the nearest hospital. For other signs that concern you, call your health care provider. Adverse reactions should be reported to the Vaccine Adverse Event Reporting System (VAERS). Your health care provider will usually file this report, or you can do it yourself. Visit the VAERS website at www.vaers.SamedayNews.es or call 570-597-3338. VAERS is only for reporting reactions, and VAERS staff members do not give medical advice. 6. The National Vaccine Injury Compensation Program The [employer] Vaccine Injury Compensation Program (VICP) is a federal program that was created to compensate people who may have been injured by certain vaccines. Claims regarding alleged injury or death due to vaccination have a time limit for filing, which may be as short as  two years. Visit the VICP website at SpiritualWord.at or call 226 154 5584 to learn about the program and about filing a claim. 7. How can I learn more? Ask your health care provider. Call your local or state health department. Visit the website of the Food and Drug Administration (FDA) for vaccine package inserts and additional information at  FinderList.no. Contact the Centers for Disease Control and Prevention (CDC): Call 269-060-4431 (1-800-CDC-INFO) or Visit CDC's website at BiotechRoom.com.cy. Source: CDC Vaccine Information Statement Inactivated Influenza Vaccine (09/25/2019) This same material is available at FootballExhibition.com.br for no charge. This information is not intended to replace advice given to you by your health care provider. Make sure you discuss any questions you have with your health care provider. Document Revised: 01/03/2021 Document Reviewed: 10/27/2020 Elsevier Patient Education  2023 [employer].

## 2022-04-02 ENCOUNTER — Other Ambulatory Visit: Payer: Self-pay | Admitting: Nurse Practitioner

## 2022-04-02 DIAGNOSIS — G43909 Migraine, unspecified, not intractable, without status migrainosus: Secondary | ICD-10-CM

## 2022-07-02 ENCOUNTER — Ambulatory Visit (INDEPENDENT_AMBULATORY_CARE_PROVIDER_SITE_OTHER): Payer: 59 | Admitting: Nurse Practitioner

## 2022-07-02 ENCOUNTER — Encounter: Payer: Self-pay | Admitting: Nurse Practitioner

## 2022-07-02 VITALS — BP 110/70 | HR 82 | Temp 98.6°F | Ht 65.0 in | Wt 132.0 lb

## 2022-07-02 DIAGNOSIS — Z13228 Encounter for screening for other metabolic disorders: Secondary | ICD-10-CM

## 2022-07-02 DIAGNOSIS — Z2821 Immunization not carried out because of patient refusal: Secondary | ICD-10-CM

## 2022-07-02 DIAGNOSIS — G43009 Migraine without aura, not intractable, without status migrainosus: Secondary | ICD-10-CM | POA: Diagnosis not present

## 2022-07-02 DIAGNOSIS — M545 Low back pain, unspecified: Secondary | ICD-10-CM

## 2022-07-02 DIAGNOSIS — Z113 Encounter for screening for infections with a predominantly sexual mode of transmission: Secondary | ICD-10-CM | POA: Diagnosis not present

## 2022-07-02 DIAGNOSIS — Z0000 Encounter for general adult medical examination without abnormal findings: Secondary | ICD-10-CM | POA: Diagnosis not present

## 2022-07-02 DIAGNOSIS — Z13220 Encounter for screening for lipoid disorders: Secondary | ICD-10-CM

## 2022-07-02 DIAGNOSIS — Z114 Encounter for screening for human immunodeficiency virus [HIV]: Secondary | ICD-10-CM

## 2022-07-02 DIAGNOSIS — Z1159 Encounter for screening for other viral diseases: Secondary | ICD-10-CM | POA: Diagnosis not present

## 2022-07-02 MED ORDER — UBRELVY 50 MG PO TABS
1.0000 | ORAL_TABLET | Freq: Every day | ORAL | 2 refills | Status: AC
Start: 2022-07-02 — End: ?

## 2022-07-02 MED ORDER — CYCLOBENZAPRINE HCL 10 MG PO TABS: 10.0000 mg | ORAL_TABLET | Freq: Three times a day (TID) | ORAL | 0 refills | Status: AC | PRN

## 2022-07-02 MED ORDER — MAGNESIUM 250 MG PO TABS: 1.0000 | ORAL_TABLET | Freq: Every day | ORAL | 2 refills | Status: AC

## 2022-07-02 NOTE — Patient Instructions (Signed)

## 2022-07-02 NOTE — Progress Notes (Signed)
Hershal Coria Martin,acting as a Neurosurgeon for Arnette Felts, FNP.,have documented all relevant documentation on the behalf of Arnette Felts, FNP,as directed by  Arnette Felts, FNP while in the presence of Arnette Felts, FNP.   Subjective:     Patient ID: Alexis Conway , female    DOB: 1995/12/28 , 27 y.o.   MRN: [id number]   Chief Complaint  Patient presents with   Annual Exam    HPI  Patient presents today for HM, patient states compliance with medications. Patient reports previously coming in about back pain the back pain did go away but is now coming back. She does have a GYN.   BP Readings from Last 3 Encounters: 07/02/22 : 110/70 12/26/21 : 114/74 08/15/21 : 108/79       Past Medical History:  Diagnosis Date   Bacterial vaginosis    Cervical intraepithelial neoplasia    Migraine      Family History  Problem Relation Age of Onset   Movement disorder Mother        Tremors   Hypertension Father    Diabetes Paternal Grandmother    Glaucoma Paternal Grandfather    Colon cancer Neg Hx    Esophageal cancer Neg Hx    Rectal cancer Neg Hx      Current Outpatient Medications:    cyclobenzaprine (FLEXERIL) 10 MG tablet, Take 1 tablet (10 mg total) by mouth 3 (three) times daily as needed for muscle spasms., Disp: 30 tablet, Rfl: 0   Etonogestrel (NEXPLANON [place]), Inject into the skin., Disp: , Rfl:    Magnesium 250 MG TABS, Take 1 tablet (250 mg total) by mouth daily., Disp: 30 tablet, Rfl: 2   metroNIDAZOLE (METROGEL) 0.75 % vaginal gel, Place 1 Applicatorful vaginally 2 (two) times daily., Disp: 70 g, Rfl: 0   SUMAtriptan (IMITREX) 50 MG tablet, TAKE 1 TABLET (50 MG TOTAL) BY MOUTH ONCE AS NEEDED FOR MIGRAINE. MAY REPEAT IN 2 HOURS IF HEADACHE PERSISTS OR RECURS., Disp: 10 tablet, Rfl: 2   Ubrogepant (UBRELVY) 50 MG TABS, Take 1 tablet (50 mg total) by mouth daily., Disp: 16 tablet, Rfl: 2   Allergies  Allergen Reactions   No Known Allergies       The patient states she  uses Nexplanon for birth control.  No LMP recorded (lmp unknown). Patient has had an implant.  Negative for Dysmenorrhea and Negative for Menorrhagia. Negative for: breast discharge, breast lump(s), breast pain and breast self exam. Associated symptoms include abnormal vaginal bleeding. Pertinent negatives include abnormal bleeding (hematology), anxiety, decreased libido, depression, difficulty falling sleep, dyspareunia, history of infertility, nocturia, sexual dysfunction, sleep disturbances, urinary incontinence, urinary urgency, vaginal discharge and vaginal itching. Diet regular.The patient states her exercise level is minimal - but she exercises at work ([employer]).   The patient's tobacco use is:  Social History   Tobacco Use  Smoking Status Never  Smokeless Tobacco Never   She has been exposed to passive smoke. The patient's alcohol use is:  Social History   Substance and Sexual Activity  Alcohol Use Yes   Alcohol/week: 1.0 standard drink of alcohol   Types: 1 Glasses of wine per week   Additional information: Last pap 12/02/2020, next one scheduled for 12/11/2023.     Review of Systems  Constitutional: Negative.   HENT: Negative.    Eyes: Negative.   Respiratory: Negative.    Cardiovascular: Negative.  Negative for chest pain, palpitations and leg swelling.  Gastrointestinal: Negative.  Endocrine: Negative.   Genitourinary: Negative.   Musculoskeletal:  Positive for back pain (has been persistent since her visit in January).  Skin: Negative.   Allergic/Immunologic: Negative.   Neurological: Negative.  Negative for dizziness and headaches.  Hematological: Negative.   Psychiatric/Behavioral: Negative.       Today's Vitals   07/02/22 1449  BP: 110/70  Pulse: 82  Temp: 98.6 F (37 C)  TempSrc: Oral  Weight: 132 lb (59.9 kg)  Height: 5\' 5"  (1.651 m)  PainSc: 4   PainLoc: Back   Body mass index is 21.97 kg/m.  Wt Readings from Last 3 Encounters:   07/02/22 132 lb (59.9 kg)  12/26/21 128 lb 9.6 oz (58.3 kg)  06/27/21 123 lb 3.2 oz (55.9 kg)    Objective:  Physical Exam Vitals reviewed.  Constitutional:      General: She is not in acute distress.    Appearance: Normal appearance. She is well-developed.  HENT:     Head: Normocephalic and atraumatic.     Right Ear: Hearing and external ear normal. There is impacted cerumen.     Left Ear: Hearing, tympanic membrane, ear canal and external ear normal. There is no impacted cerumen.     Nose:     Comments: Deferred - masked    Mouth/Throat:     Comments: Deferred - masked Eyes:     General: Lids are normal.     Extraocular Movements: Extraocular movements intact.     Conjunctiva/sclera: Conjunctivae normal.     Pupils: Pupils are equal, round, and reactive to light.     Funduscopic exam:    Right eye: No papilledema.        Left eye: No papilledema.  Neck:     Thyroid: No thyroid mass.     Vascular: No carotid bruit.  Cardiovascular:     Rate and Rhythm: Normal rate and regular rhythm.     Pulses: Normal pulses.     Heart sounds: Normal heart sounds. No murmur heard. Pulmonary:     Effort: Pulmonary effort is normal. No respiratory distress.     Breath sounds: Normal breath sounds. No wheezing.  Chest:     Chest wall: No mass.  Breasts:    Tanner Score is 5.     Right: Normal. No mass or tenderness.     Left: Normal. No mass or tenderness.  Abdominal:     General: Abdomen is flat. Bowel sounds are normal. There is no distension.     Palpations: Abdomen is soft.     Tenderness: There is no abdominal tenderness.  Genitourinary:    Rectum: Guaiac result negative.  Musculoskeletal:        General: No swelling or tenderness. Normal range of motion.     Cervical back: Full passive range of motion without pain, normal range of motion and neck supple.     Right lower leg: No edema.     Left lower leg: No edema.     Comments: Pain to low back with straight leg raise   Lymphadenopathy:     Upper Body:     Right upper body: No supraclavicular, axillary or pectoral adenopathy.     Left upper body: No supraclavicular, axillary or pectoral adenopathy.  Skin:    General: Skin is warm and dry.     Capillary Refill: Capillary refill takes less than 2 seconds.  Neurological:     General: No focal deficit present.     Mental Status: She is  alert and oriented to person, place, and time.     Cranial Nerves: No cranial nerve deficit.     Sensory: No sensory deficit.     Motor: No weakness.  Psychiatric:        Mood and Affect: Mood normal.        Behavior: Behavior normal.        Thought Content: Thought content normal.        Judgment: Judgment normal.         Assessment And Plan:     1. Encounter for annual physical exam Behavior modifications discussed and diet history reviewed.   Pt will continue to exercise regularly and modify diet with low GI, plant based foods and decrease intake of processed foods.  Recommend intake of daily multivitamin, Vitamin D, and calcium.  Recommend self breast exams for preventive screenings, as well as recommend immunizations that include influenza, TDAP Health Maintenance reviewed - will check [facility] for her tdap and declined covid vaccine booster.  Health Maintenance  Topic Date Due   DTaP/Tdap/Td vaccine (7 - Td or Tdap) 09/23/2016   COVID-19 Vaccine (5 - 2023-24 season) 10/20/2021   Flu Shot  09/20/2022   Pap Smear  12/03/2023   Pap Smear  12/03/2023   HPV Vaccine  Completed   Hepatitis C Screening  Completed   HIV Screening  Completed   2. Screening for STDs (sexually transmitted diseases) - NuSwab Vaginitis Plus (VG+) - Hepatitis B surface antigen - RPR - HSV 1 and 2 Ab, IgG  3. Encounter for screening for metabolic disorder - Hemoglobin A1c  4. Encounter for screening for lipid disorder - Lipid panel  5. Encounter for HIV (human immunodeficiency virus) test - HIV antibody (with reflex)  6.  Encounter for hepatitis C screening test for low risk patient Will check Hepatitis C screening due to recent recommendations to screen all adults 18 years and older - Hepatitis C antibody  7. Tetanus, diphtheria, and acellular pertussis (Tdap) vaccination declined  8. Migraine without aura and without status migrainosus, not intractable Comments: Sumitriptan is effective most of the time, will provide samples of ubrelvy and see if insurance covers. And to see if more effective. - CBC with Differential/Platelet - CMP14+EGFR - Magnesium 250 MG TABS; Take 1 tablet (250 mg total) by mouth daily.  Dispense: 30 tablet; Refill: 2 - Ubrogepant (UBRELVY) 50 MG TABS; Take 1 tablet (50 mg total) by mouth daily.  Dispense: 16 tablet; Refill: 2  9. Acute bilateral low back pain without sciatica Comments: Positive straight leg raise and has pain with palpation to low back, will treat with cyclobenzaprine - cyclobenzaprine (FLEXERIL) 10 MG tablet; Take 1 tablet (10 mg total) by mouth 3 (three) times daily as needed for muscle spasms.  Dispense: 30 tablet; Refill: 0   Return for 1 year physical. Patient was given opportunity to ask questions. Patient verbalized understanding of the plan and was able to repeat key elements of the plan. All questions were answered to their satisfaction.   Arnette Felts, FNP   I, Arnette Felts, FNP, have reviewed all documentation for this visit. The documentation on 07/02/22 for the exam, diagnosis, procedures, and orders are all accurate and complete.   THE PATIENT IS ENCOURAGED TO PRACTICE SOCIAL DISTANCING DUE TO THE COVID-19 PANDEMIC.

## 2022-07-03 LAB — CBC WITH DIFFERENTIAL/PLATELET
Basophils Absolute: 0.1 10*3/uL (ref 0.0–0.2)
Basos: 1 %
EOS (ABSOLUTE): 0.2 10*3/uL (ref 0.0–0.4)
Eos: 3 %
Hematocrit: 38.3 % (ref 34.0–46.6)
Hemoglobin: 12.5 g/dL (ref 11.1–15.9)
Immature Grans (Abs): 0 10*3/uL (ref 0.0–0.1)
Immature Granulocytes: 0 %
Lymphocytes Absolute: 2.2 10*3/uL (ref 0.7–3.1)
Lymphs: 29 %
MCH: 27.8 pg (ref 26.6–33.0)
MCHC: 32.6 g/dL (ref 31.5–35.7)
MCV: 85 fL (ref 79–97)
Monocytes Absolute: 0.6 10*3/uL (ref 0.1–0.9)
Monocytes: 7 %
Neutrophils Absolute: 4.6 10*3/uL (ref 1.4–7.0)
Neutrophils: 60 %
Platelets: 292 10*3/uL (ref 150–450)
RBC: 4.49 x10E6/uL (ref 3.77–5.28)
RDW: 12.1 % (ref 11.7–15.4)
WBC: 7.7 10*3/uL (ref 3.4–10.8)

## 2022-07-03 LAB — LIPID PANEL
Chol/HDL Ratio: 3 ratio (ref 0.0–4.4)
Cholesterol, Total: 134 mg/dL (ref 100–199)
HDL: 44 mg/dL (ref >39)
LDL Chol Calc (NIH): 74 mg/dL (ref 0–99)
Triglycerides: 82 mg/dL (ref 0–149)
VLDL Cholesterol Cal: 16 mg/dL (ref 5–40)

## 2022-07-03 LAB — CMP14+EGFR
ALT: 7 IU/L (ref 0–32)
AST: 14 IU/L (ref 0–40)
Albumin/Globulin Ratio: 1.9 (ref 1.2–2.2)
Albumin: 4.3 g/dL (ref 4.0–5.0)
Alkaline Phosphatase: 67 IU/L (ref 44–121)
BUN/Creatinine Ratio: 17 (ref 9–23)
BUN: 17 mg/dL (ref 6–20)
Bilirubin Total: 0.2 mg/dL (ref 0.0–1.2)
CO2: 21 mmol/L (ref 20–29)
Calcium: 9.6 mg/dL (ref 8.7–10.2)
Chloride: 105 mmol/L (ref 96–106)
Creatinine, Ser: 0.98 mg/dL (ref 0.57–1.00)
Globulin, Total: 2.3 g/dL (ref 1.5–4.5)
Glucose: 79 mg/dL (ref 70–99)
Potassium: 4.6 mmol/L (ref 3.5–5.2)
Sodium: 140 mmol/L (ref 134–144)
Total Protein: 6.6 g/dL (ref 6.0–8.5)
eGFR: 82 mL/min/{1.73_m2} (ref >59)

## 2022-07-03 LAB — HEMOGLOBIN A1C
Est. average glucose Bld gHb Est-mCnc: 108 mg/dL
Hgb A1c MFr Bld: 5.4 % (ref 4.8–5.6)

## 2022-07-03 LAB — HEPATITIS B SURFACE ANTIGEN: Hepatitis B Surface Ag: NEGATIVE

## 2022-07-03 LAB — RPR: RPR Ser Ql: NONREACTIVE

## 2022-07-03 LAB — HSV 1 AND 2 AB, IGG
HSV 1 Glycoprotein G Ab, IgG: 3.21 index — ABNORMAL HIGH (ref 0.00–0.90)
HSV 2 IgG, Type Spec: <0.91 index (ref 0.00–0.90)

## 2022-07-03 LAB — HEPATITIS C ANTIBODY: Hep C Virus Ab: NONREACTIVE

## 2022-07-03 LAB — HIV ANTIBODY (ROUTINE TESTING W REFLEX): HIV Screen 4th Generation wRfx: NONREACTIVE

## 2022-07-04 LAB — NUSWAB VAGINITIS PLUS (VG+)
Candida albicans, NAA: NEGATIVE
Candida glabrata, NAA: NEGATIVE
Chlamydia trachomatis, NAA: NEGATIVE
Neisseria gonorrhoeae, NAA: NEGATIVE
Trich vag by NAA: NEGATIVE

## 2022-07-11 ENCOUNTER — Encounter: Payer: Self-pay | Admitting: Nurse Practitioner

## 2022-07-11 ENCOUNTER — Other Ambulatory Visit: Payer: Self-pay | Admitting: Nurse Practitioner

## 2022-07-11 DIAGNOSIS — B9689 Other specified bacterial agents as the cause of diseases classified elsewhere: Secondary | ICD-10-CM

## 2022-07-11 MED ORDER — METRONIDAZOLE 500 MG PO TABS
500.0000 mg | ORAL_TABLET | Freq: Three times a day (TID) | ORAL | 0 refills | Status: DC
Start: 2022-07-11 — End: 2022-07-11

## 2022-07-11 MED ORDER — METRONIDAZOLE 0.75 % VA GEL
1.0000 | Freq: Two times a day (BID) | VAGINAL | 0 refills | Status: AC
Start: 2022-07-11 — End: ?

## 2022-07-16 DIAGNOSIS — M545 Low back pain, unspecified: Secondary | ICD-10-CM | POA: Insufficient documentation

## 2022-08-14 ENCOUNTER — Other Ambulatory Visit: Payer: Self-pay | Admitting: Nurse Practitioner

## 2022-08-14 DIAGNOSIS — G43909 Migraine, unspecified, not intractable, without status migrainosus: Secondary | ICD-10-CM

## 2023-02-05 DIAGNOSIS — Z01411 Encounter for gynecological examination (general) (routine) with abnormal findings: Secondary | ICD-10-CM | POA: Diagnosis not present

## 2023-02-05 DIAGNOSIS — Z1389 Encounter for screening for other disorder: Secondary | ICD-10-CM | POA: Diagnosis not present

## 2023-02-05 DIAGNOSIS — Z113 Encounter for screening for infections with a predominantly sexual mode of transmission: Secondary | ICD-10-CM | POA: Diagnosis not present

## 2023-02-05 DIAGNOSIS — Z8742 Personal history of other diseases of the female genital tract: Secondary | ICD-10-CM | POA: Diagnosis not present

## 2023-02-05 DIAGNOSIS — Z130 Encounter for screening for diseases of the blood and blood-forming organs and certain disorders involving the immune mechanism: Secondary | ICD-10-CM | POA: Diagnosis not present

## 2023-02-05 DIAGNOSIS — Z7689 Persons encountering health services in other specified circumstances: Secondary | ICD-10-CM | POA: Diagnosis not present

## 2023-06-05 IMAGING — CR DG LUMBAR SPINE COMPLETE 4+V
5 series · 5 of 5 positions shown · non-contrast
Comparison: None Available.

CLINICAL DATA: Chronic right-sided low back pain with right-sided
sciatica.

Low back and right leg pain for 6 months.  No known injury.
EXAM:
LUMBAR SPINE - COMPLETE 4+ VIEW

[t l-spine a.p.]
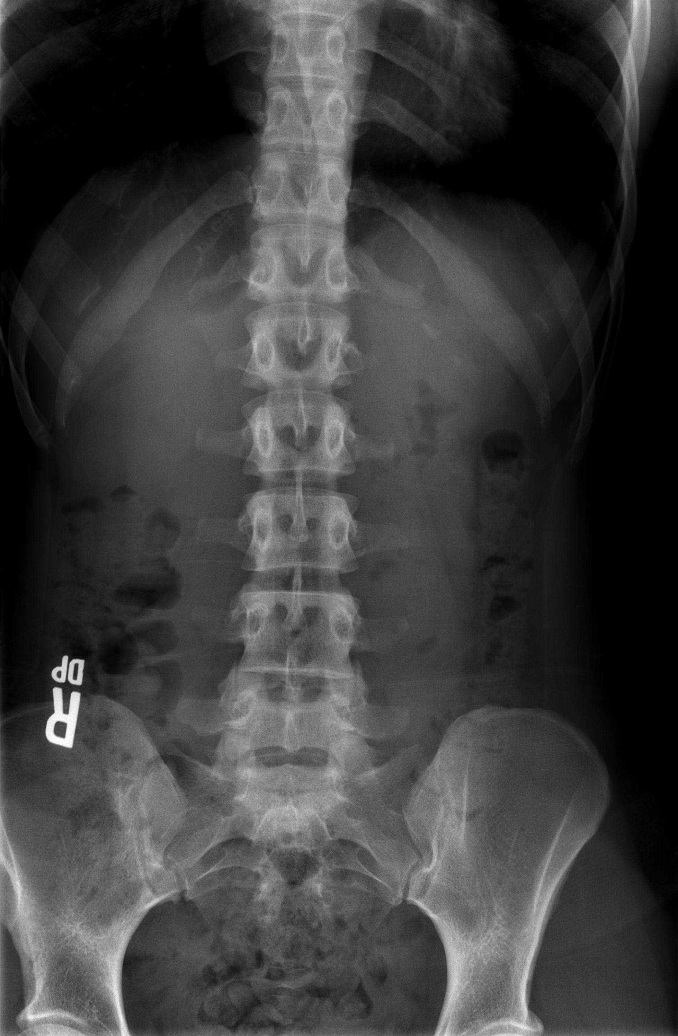

[t l-spine oblique exposure (1 of 2)]
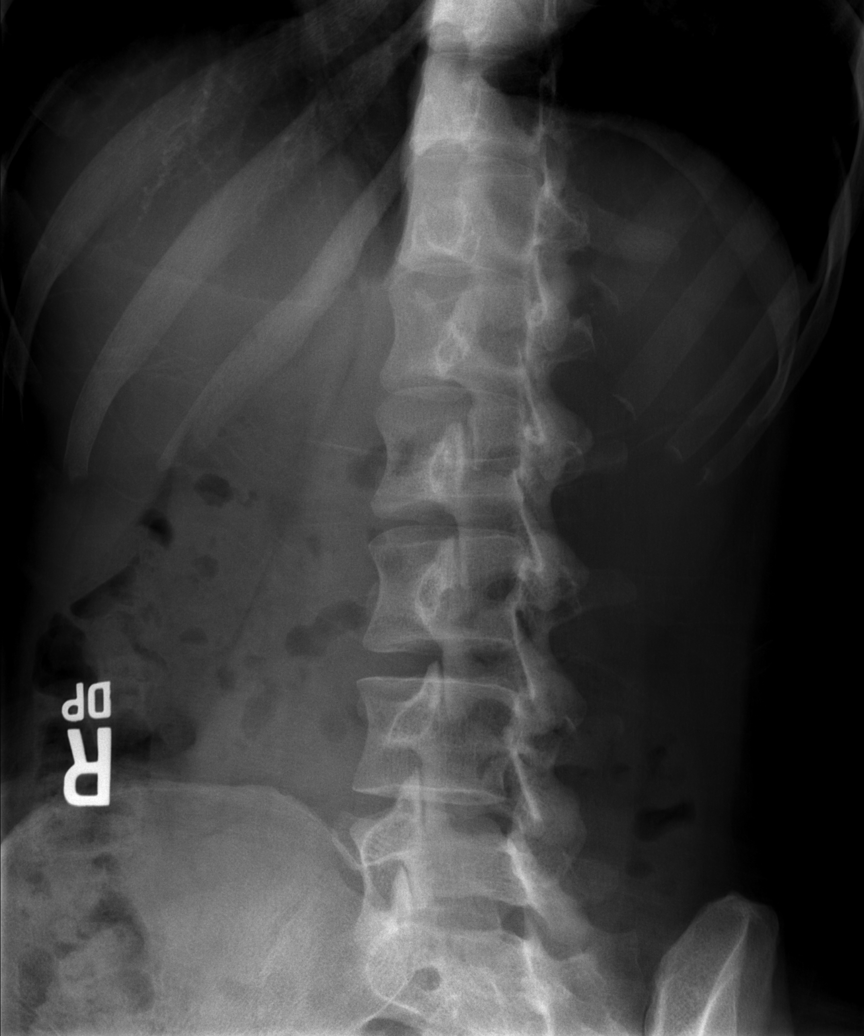

[t l-spine oblique exposure (2 of 2)]
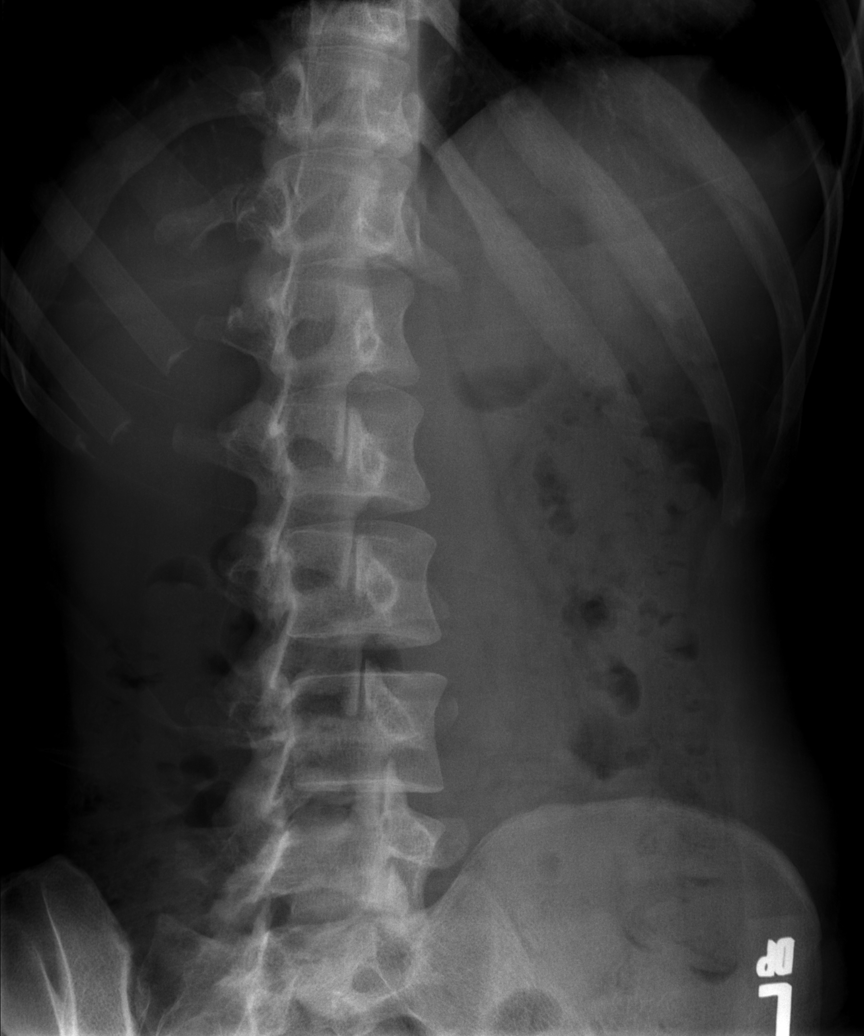

[t l-spine lat]
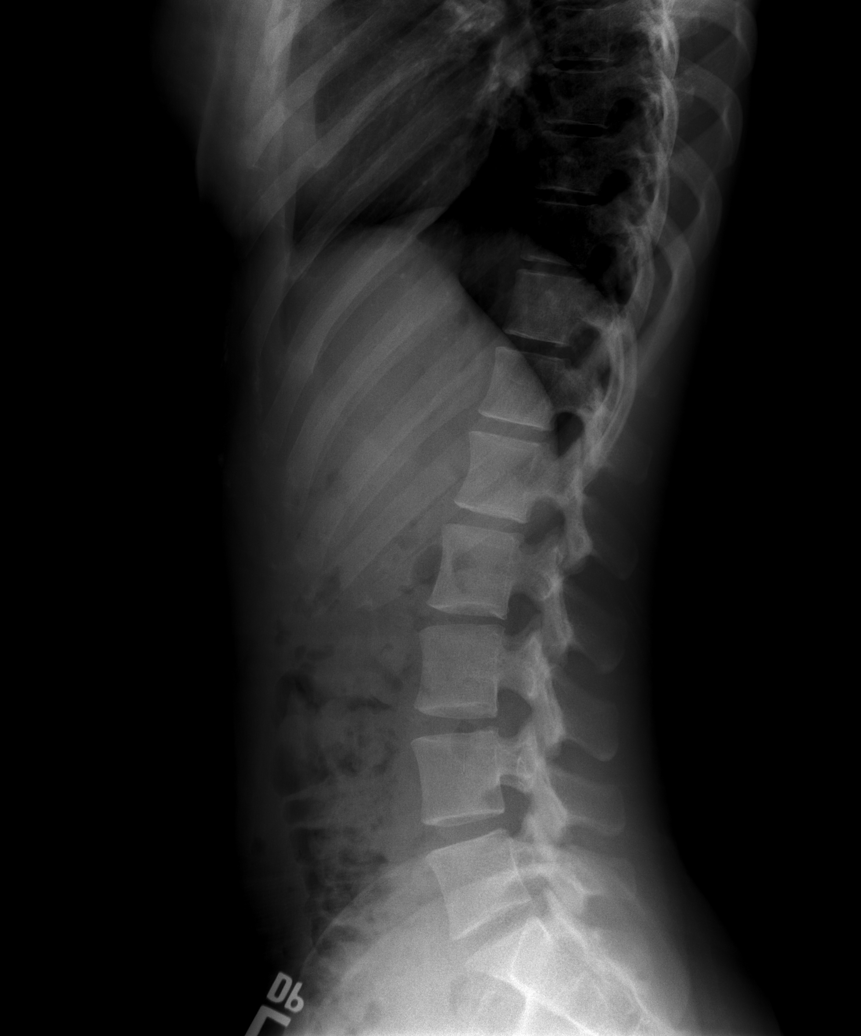

[t l-spine l5-s1 spot]
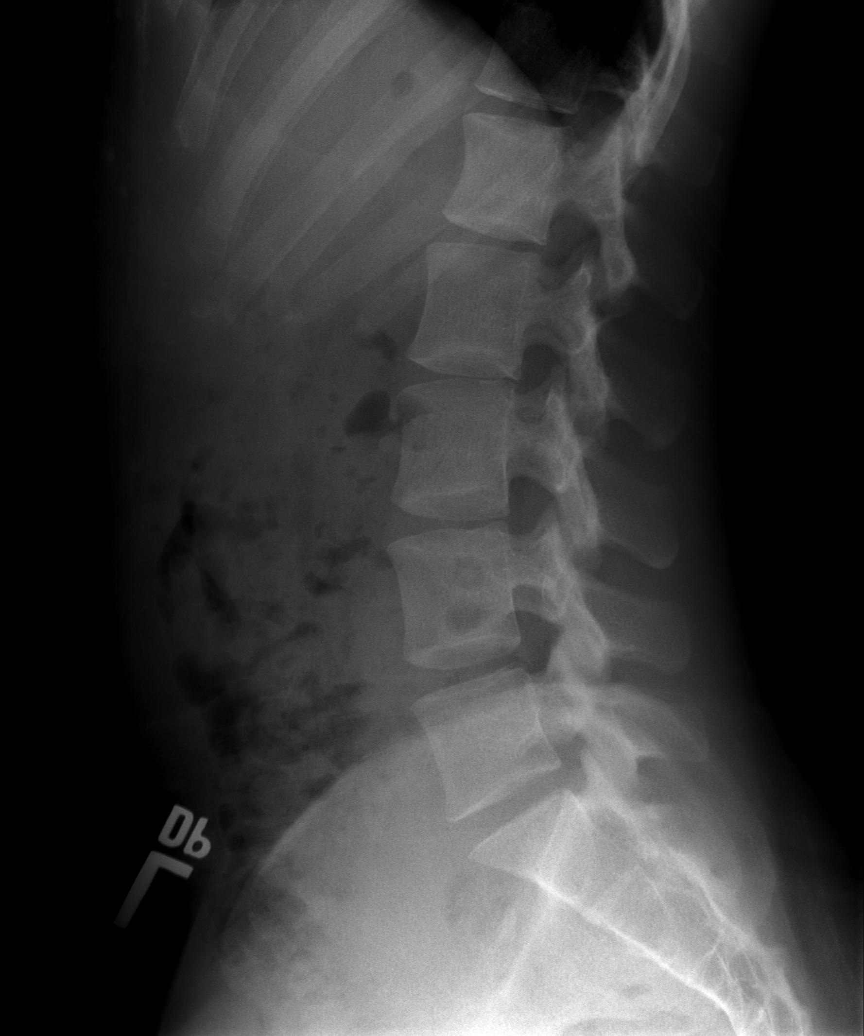

[5 of 5 positions shown; findings below may reference images not displayed]

FINDINGS: There are 5 non-rib-bearing lumbar vertebra with presumed diminutive
ribs at T12. Normal alignment. No listhesis. Normal vertebral body
heights. Normal intervertebral disc spaces. No fracture, focal bone
abnormality, or visualized pars defects. The sacroiliac joints are
congruent
IMPRESSION: Negative radiographs of the lumbar spine.

## 2023-07-03 ENCOUNTER — Encounter: Payer: Commercial Managed Care - HMO | Admitting: Nurse Practitioner

## 2024-04-15 ENCOUNTER — Encounter: Payer: Self-pay | Admitting: Nurse Practitioner
